# Patient Record
Sex: Male | Born: 1945 | Race: Black or African American | Hispanic: No | Smoking: Former smoker
Health system: Southern US, Community
[De-identification: ages and names within clinical notes are randomized; demographics above are authoritative.]

## PROBLEM LIST (undated history)

## (undated) DIAGNOSIS — M1990 Unspecified osteoarthritis, unspecified site: Secondary | ICD-10-CM

## (undated) DIAGNOSIS — G4730 Sleep apnea, unspecified: Secondary | ICD-10-CM

## (undated) DIAGNOSIS — K5790 Diverticulosis of intestine, part unspecified, without perforation or abscess without bleeding: Secondary | ICD-10-CM

## (undated) DIAGNOSIS — H409 Unspecified glaucoma: Secondary | ICD-10-CM

## (undated) DIAGNOSIS — E291 Testicular hypofunction: Secondary | ICD-10-CM

## (undated) DIAGNOSIS — T7840XA Allergy, unspecified, initial encounter: Secondary | ICD-10-CM

## (undated) DIAGNOSIS — G43909 Migraine, unspecified, not intractable, without status migrainosus: Secondary | ICD-10-CM

## (undated) DIAGNOSIS — E785 Hyperlipidemia, unspecified: Secondary | ICD-10-CM

## (undated) DIAGNOSIS — E119 Type 2 diabetes mellitus without complications: Secondary | ICD-10-CM

## (undated) DIAGNOSIS — I10 Essential (primary) hypertension: Secondary | ICD-10-CM

## (undated) HISTORY — DX: Essential (primary) hypertension: I10

## (undated) HISTORY — DX: Migraine, unspecified, not intractable, without status migrainosus: G43.909

## (undated) HISTORY — DX: Diverticulosis of intestine, part unspecified, without perforation or abscess without bleeding: K57.90

## (undated) HISTORY — DX: Hyperlipidemia, unspecified: E78.5

## (undated) HISTORY — DX: Unspecified glaucoma: H40.9

## (undated) HISTORY — DX: Sleep apnea, unspecified: G47.30

## (undated) HISTORY — DX: Unspecified osteoarthritis, unspecified site: M19.90

## (undated) HISTORY — DX: Allergy, unspecified, initial encounter: T78.40XA

## (undated) HISTORY — DX: Testicular hypofunction: E29.1

## (undated) HISTORY — DX: Type 2 diabetes mellitus without complications: E11.9

---

## 1990-01-20 HISTORY — PX: ANTERIOR CRUCIATE LIGAMENT REPAIR: SHX115

## 2006-01-20 HISTORY — PX: ROTATOR CUFF REPAIR: SHX139

## 2006-03-19 ENCOUNTER — Ambulatory Visit: Payer: Self-pay | Admitting: Internal Medicine

## 2006-03-31 ENCOUNTER — Ambulatory Visit: Payer: Self-pay | Admitting: Internal Medicine

## 2006-03-31 HISTORY — PX: COLONOSCOPY: SHX174

## 2008-08-30 ENCOUNTER — Encounter: Payer: Self-pay | Admitting: Cardiology

## 2008-09-06 ENCOUNTER — Encounter (INDEPENDENT_AMBULATORY_CARE_PROVIDER_SITE_OTHER): Payer: Self-pay | Admitting: *Deleted

## 2013-10-10 ENCOUNTER — Encounter: Payer: Self-pay | Admitting: Neurology

## 2013-10-14 ENCOUNTER — Encounter: Payer: Self-pay | Admitting: Neurology

## 2013-10-14 ENCOUNTER — Ambulatory Visit (INDEPENDENT_AMBULATORY_CARE_PROVIDER_SITE_OTHER): Payer: Federal, State, Local not specified - PPO | Admitting: Neurology

## 2013-10-14 ENCOUNTER — Encounter (INDEPENDENT_AMBULATORY_CARE_PROVIDER_SITE_OTHER): Payer: Self-pay

## 2013-10-14 VITALS — BP 125/85 | HR 61 | Resp 17 | Ht 74.4 in | Wt 212.0 lb

## 2013-10-14 DIAGNOSIS — R0609 Other forms of dyspnea: Secondary | ICD-10-CM

## 2013-10-14 DIAGNOSIS — M2610 Unspecified anomaly of jaw-cranial base relationship: Secondary | ICD-10-CM

## 2013-10-14 DIAGNOSIS — G4726 Circadian rhythm sleep disorder, shift work type: Secondary | ICD-10-CM | POA: Insufficient documentation

## 2013-10-14 DIAGNOSIS — R0989 Other specified symptoms and signs involving the circulatory and respiratory systems: Secondary | ICD-10-CM

## 2013-10-14 DIAGNOSIS — M2619 Other specified anomalies of jaw-cranial base relationship: Secondary | ICD-10-CM | POA: Insufficient documentation

## 2013-10-14 DIAGNOSIS — G4733 Obstructive sleep apnea (adult) (pediatric): Secondary | ICD-10-CM | POA: Insufficient documentation

## 2013-10-14 DIAGNOSIS — Z91199 Patient's noncompliance with other medical treatment and regimen due to unspecified reason: Secondary | ICD-10-CM

## 2013-10-14 DIAGNOSIS — R0683 Snoring: Secondary | ICD-10-CM | POA: Insufficient documentation

## 2013-10-14 DIAGNOSIS — Z9119 Patient's noncompliance with other medical treatment and regimen: Secondary | ICD-10-CM | POA: Insufficient documentation

## 2013-10-14 NOTE — Patient Instructions (Signed)
Polysomnography (Sleep Studies) Polysomnography (PSG) is a series of tests used for detecting (diagnosing) obstructive sleep apnea and other sleep disorders. The tests measure how some parts of your body are working while you are sleeping. The tests are extensive and expensive. They are done in a sleep lab or hospital, and vary from center to center. Your caregiver may perform other more simple sleep studies and questionnaires before doing more complete and involved testing. Testing may not be covered by insurance. Some of these tests are:  An EEG (Electroencephalogram). This tests your brain waves and stages of sleep.  An EOG (Electrooculogram). This measures the movements of your eyes. It detects periods of REM (rapid eye movement) sleep, which is your dream sleep.  An EKG (Electrocardiogram). This measures your heart rhythm.  EMG (Electromyography). This is a measurement of how the muscles are working in your upper airway and your legs while sleeping.  An oximetry measurement. It measures how much oxygen (air) you are getting while sleeping.  Breathing efforts may be measured. The same test can be interpreted (understood) differently by different caregivers and centers that study sleep.  Studies may be given an apnea/hypopnea index (AHI). This is a number which is found by counting the times of no breathing or under breathing during the night, and relating those numbers to the amount of time spent in bed. When the AHI is greater than 15, the patient is likely to complain of daytime sleepiness. When the AHI is greater than 30, the patient is at increased risk for heart problems and must be followed more closely. Following the AHI also allows you to know how treatment is working. Simple oximetry (tracking the amount of oxygen that is taken in) can be used for screening patients who:  Do not have symptoms (problems) of OSA.  Have a normal Epworth Sleepiness Scale Score.  Have a low pre-test  probability of having OSA.  Have none of the upper airway problems likely to cause apnea.  Oximetry is also used to determine if treatment is effective in patients who showed significant desaturations (not getting enough oxygen) on their home sleep study. One extra measure of safety is to perform additional studies for the person who only snores. This is because no one can predict with absolute certainty who will have OSA. Those who show significant desaturations (not getting enough oxygen) are recommended to have a more detailed sleep study. Document Released: 07/13/2002 Document Revised: 03/31/2011 Document Reviewed: 03/14/2013 ExitCare Patient Information 2015 [employer]. This information is not intended to replace advice given to you by your health care provider. Make sure you discuss any questions you have with your health care provider.  

## 2013-10-14 NOTE — Progress Notes (Signed)
SLEEP MEDICINE CLINIC   Provider:  Larey Seat, M D  Referring Provider: Haywood Pao, MD Primary Care Physician:  Haywood Pao, MD  Chief Complaint  Patient presents with  . New Evaluation    Room 11  . Sleep consult    HPI:  Jerry Caldwell is a 68 y.o. afro-american , right handed , married  male , who is seen here as a referral from Dr. Alfonso Patten. Tisovec for a sleep evaluation.  [name] has a complicating history of Diabetes, HTN, hyperlipidemia, hypogonadism and is on multiple testosterone levels and erectile dysfunction. The patient also has common migraines and an AV block first degree. The patient was diagnosed with OSA in 2011 at the [facility] , Dr. Jonathon Bellows  prescribed a CPAP- he was never comfortable with using it, tried full facial masks ( unable to find a good seal ) , nasal masks and nasal pillows, Now  On pillows he has sinusitis and rhinitis, but he has discharge and still a dry mouth.     He is working a late shift. He works form 3 Pm to 11.30 PM and  Eats dinner at work, he reaches Home at midnight, watches the news and has a snack. He goes to bed around 2 AM and usually sleeps promptly.  He cannot recall dreams,  2 nocturias , used to have 4-5 before using a bladder control medication.  He takes HCTZ, a diuretic in AM. His spouse reports he still snores on CPAP with nasal pillow. He wakes up spontaneously at 8 AM and feels "OK" not usually refreshed but tolerable. He goes to the [employer] before going to work. 2 cups of iced coffee. He takes iced tea to work to drink and stay awake. He often has migraine in the morning, started Topiramate upon [facility] in 2014, no HA now.  In late August the patient sprained his ankle which was sore for a few days but is better now apparently there was no fracture found he has been taking naproxen to treat this pain. The patient has seen Dr Levin Bacon orthopedics and after a shot of cortisone has been able to resume  his usual exercise routine. He walks and runs at least twice a week.   The patient is a former smoker quit in 1989. He does not use alcohol and he drinks 2 caffeinated beverages a day. The patient is married with 4 children and works in the [employer]. He is also a  Scientist, research (life sciences) for 22 years. He has no history of ENT, facial , neck , spine surgery or injury.  He is not obese and has never been morbidly obese. The highest weight was 230 pounds  , at a height of 6.2 - that' was  his weight when he developed diabetes in 2012.   No  Personal or family history of sleep disorder. His younger sister has a sleep study in the next month.  [name] has kindly brought his CPAP to today's visit -he used to machine only the last 4 days of 30 days at about 3 hours 34 minutes total time . His AHI was very high in spite of using a by flexible  BiPAP  The AHI was 30.9!  He has an EPAP pressure of 12.5 and an IPAP pressure of 25 cm water -  I cannot see how the patient would benefit from the current BiPAP  system having this large residual AHI.  The patient never has been  on a plain PAP before ,  he was started on BiPAP. I do not have access  today to his [facility] based  sleep study reports.  Assessment  : this patient obviously has been diagnosed with sleep apnea before but I don't know what his diagnostic AHI may have been. Looking at his download adult that he benefits right now from using a BiPAP flex  Machine.  I would like for this patient to be reevaluated and retreated. He is currently not suffering from any headaches in the morning -CO2 measure may not have to be obtained.  I would like for him to have a shift work adjusted sleep study which would allow him to come in at midnight and conclude the  study at around 8 AM  Review of Systems: Out of a complete 14 system review, the patient complains of only the following symptoms, and all other reviewed systems are negative.   Epworth score  10 , Fatigue  severity score 15  , depression score 1    History   Social History  . Marital Status: Married    Spouse Name: Jerry Caldwell    Number of Children: 4  . Years of Education: College   Occupational History  . Not on file.   Social History Main Topics  . Smoking status: Former Research scientist (life sciences)  . Smokeless tobacco: Never Used  . Alcohol Use: No  . Drug Use: No  . Sexual Activity: Not on file   Other Topics Concern  . Not on file   Social History Narrative   Patient is married Jerry Caldwell)    Patient has four children.   Patient has a college degree.   Patient is a Special educational needs teacher.   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   Patient is right-handed.   Patient drinks caffeine, coffee - occasionally and tea-three times per week.    Family History  Problem Relation Age of Onset  . Glaucoma Mother   . Diabetes type II Maternal Aunt   . Diabetes Sister     Past Medical History  Diagnosis Date  . HTN (hypertension)   . Hyperlipidemia   . Hypogonadism in male   . Migraines   . Diabetes     Past Surgical History  Procedure Laterality Date  . Anterior cruciate ligament repair  1992  . Rotator cuff repair Right 2008    Current Outpatient Prescriptions  Medication Sig Dispense Refill  . atenolol (TENORMIN) 25 MG tablet Take 25 mg by mouth daily.      Marland Kitchen atorvastatin (LIPITOR) 40 MG tablet Take 40 mg by mouth daily.      . Chlorpheniramine-APAP (CORICIDIN) 2-325 MG TABS Take by mouth. As needed      . hydrochlorothiazide (HYDRODIURIL) 25 MG tablet Take 25 mg by mouth daily.      Marland Kitchen latanoprost (XALATAN) 0.005 % ophthalmic solution Place 1 drop into both eyes at bedtime.       Marland Kitchen losartan (COZAAR) 50 MG tablet Take 50 mg by mouth daily.      . metFORMIN (GLUCOPHAGE) 500 MG tablet Take 500 mg by mouth daily.      . naproxen (NAPROSYN) 500 MG tablet Take 500 mg by mouth. As needed      . Testosterone (ANDROGEL) 20.25 MG/1.25GM (1.62%) GEL Place 1 application onto the skin daily.       . timolol (TIMOPTIC) 0.5 % ophthalmic solution Place 1 drop into both eyes 2 (two) times daily.  No current facility-administered medications for this visit.    Allergies as of 10/14/2013  . (Not on File)    Vitals: BP 125/85  Pulse 61  Resp 17 Last Weight:  Wt Readings from Last 1 Encounters:  No data found for Wt       Last Height:   Ht Readings from Last 1 Encounters:  No data found for Ht    Physical exam:  General: The patient is awake, alert and appears not in acute distress. The patient is well groomed. Head: Normocephalic, atraumatic. Neck is supple. Mallampati 4 ,  neck circumference: 16.5  Nasal airflow restricted, congested , TMJ is not evident . Retrognathia is seen.  Cardiovascular:  Regular rate and rhythm , without  murmurs or carotid bruit, and without distended neck veins. Respiratory: Lungs are clear to auscultation. Skin:  Without evidence of edema, or rash Trunk: BMI is elevated and patient  has normal posture.  Neurologic exam : The patient is awake and alert, oriented to place and time.   Memory subjective  described as intact. There is a normal attention span & concentration ability. Speech is fluent without dysarthria, dysphonia or aphasia. Mood and affect are appropriate.  Cranial nerves:  Sense of smell and taste reported normal.  Pupils are equal and briskly reactive to light. Funduscopic exam without  evidence of pallor or edema.  Extraocular movements  in vertical and horizontal planes intact and without nystagmus. Visual fields by finger perimetry are intact. Hearing to finger rub intact. Facial sensation intact to fine touch. Facial motor strength is symmetric and tongue  Midline. Uvula cannot bevisualized   Motor exam:   Normal tone, muscle bulk and symmetric strength in all extremities. No change in fine motor skills, handwriting, etc.   Sensory:  Fine touch, pinprick and vibration were tested in all extremities.  Proprioception is  normal.  Coordination: Rapid alternating movements in the fingers/hands is normal.   Gait and station: Patient walks without assistive device and is able unassisted to climb up to the exam table.  Strength within normal limits. Stance is stable and normal. Tandem gait is unfragmented. Romberg testing is   negative.  Deep tendon reflexes: in the  upper and lower extremities are symmetric and intact. Babinski  downgoing.   Assessment:  After physical and neurologic examination, review of laboratory studies, imaging, neurophysiology testing and pre-existing records, assessment is  1) patient with unknown level of sleep apnea, on BiPAP , never failed CPAP. 2) current BiPAP is not working , AHI is 30 ! Last [facility] visit was 2 years ago.  3) current mask causes sinus trouble.  4) shift work Environmental manager, cumulative sleep deprivation.  5) retrognathia and macroglossia, mallompati 4 - high risk OSA and snoring.    The patient was advised of the nature of the diagnosed sleep disorder , the treatment options and risks for general a health and wellness arising from not treating the condition. Visit duration was 45 minutes.   Plan:  Treatment plan and additional workup : Re-evaluation , SPLIT at 29 and score at 4 % - plain BCBS Medicare -[employer].  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  Headaches  resolved on Topiramate, still if available , check CO2 !.  Patient will bring his study data to the sleep lab appointment.       Asencion Partridge Simpson Paulos MD  10/14/2013

## 2013-12-27 ENCOUNTER — Encounter: Payer: Self-pay | Admitting: Neurology

## 2013-12-28 ENCOUNTER — Ambulatory Visit (INDEPENDENT_AMBULATORY_CARE_PROVIDER_SITE_OTHER): Payer: Federal, State, Local not specified - PPO | Admitting: Neurology

## 2013-12-28 DIAGNOSIS — R0683 Snoring: Secondary | ICD-10-CM

## 2013-12-28 DIAGNOSIS — G4726 Circadian rhythm sleep disorder, shift work type: Secondary | ICD-10-CM

## 2013-12-28 DIAGNOSIS — Z9119 Patient's noncompliance with other medical treatment and regimen: Secondary | ICD-10-CM

## 2013-12-28 DIAGNOSIS — Z91199 Patient's noncompliance with other medical treatment and regimen due to unspecified reason: Secondary | ICD-10-CM

## 2013-12-28 DIAGNOSIS — M2619 Other specified anomalies of jaw-cranial base relationship: Secondary | ICD-10-CM

## 2013-12-28 DIAGNOSIS — G4733 Obstructive sleep apnea (adult) (pediatric): Secondary | ICD-10-CM

## 2013-12-29 NOTE — Sleep Study (Signed)
Please see the scanned sleep study interpretation located in the Procedure tab within the Chart Review section. 

## 2014-01-06 ENCOUNTER — Other Ambulatory Visit: Payer: Self-pay | Admitting: Neurology

## 2014-01-06 ENCOUNTER — Telehealth: Payer: Self-pay | Admitting: Neurology

## 2014-01-06 DIAGNOSIS — G4733 Obstructive sleep apnea (adult) (pediatric): Secondary | ICD-10-CM

## 2014-01-06 NOTE — Telephone Encounter (Signed)
I called the patient and reviewed the finding of his sleep study results with him.  He is aware of the finding of obstructive sleep apnea.  Due to this finding, Dr. Brett Fairy has recommended that he return for a CPAP titration to optimize therapy.  He is aware that he will be contacted with an appointment for his CPAP titration.  The referring provider will be sent a copy of this report and the patient has requested to send his report via mail.

## 2014-02-15 ENCOUNTER — Ambulatory Visit (INDEPENDENT_AMBULATORY_CARE_PROVIDER_SITE_OTHER): Payer: Federal, State, Local not specified - PPO | Admitting: Neurology

## 2014-02-15 DIAGNOSIS — G4733 Obstructive sleep apnea (adult) (pediatric): Secondary | ICD-10-CM

## 2014-02-16 NOTE — Sleep Study (Signed)
Please see the scanned sleep study interpretation located in the Procedure tab within the chart review section

## 2014-02-24 ENCOUNTER — Encounter: Payer: Self-pay | Admitting: Neurology

## 2014-02-24 ENCOUNTER — Telehealth: Payer: Self-pay | Admitting: *Deleted

## 2014-02-24 ENCOUNTER — Other Ambulatory Visit: Payer: Self-pay | Admitting: Neurology

## 2014-02-24 ENCOUNTER — Encounter: Payer: Self-pay | Admitting: *Deleted

## 2014-02-24 DIAGNOSIS — G4733 Obstructive sleep apnea (adult) (pediatric): Secondary | ICD-10-CM

## 2014-02-24 NOTE — Telephone Encounter (Signed)
Patient was contacted and provided the results of his most recent CPAP titration study.  Patient was advised that CPAP therapy was effective in treating his sleep apnea.  Patient was informed that CPAP therapy had been recommended for home use by Dr. Brett Fairy.  The patient was in agreement and was referred to [facility] for CPAP set up.  Dr. Domenick Gong was faxed a copy of the report.  The patient gave verbal permission to mail a copy of his test results.   Patient instructed to contact our office 6-8 weeks post set up to schedule a follow up appointment.

## 2014-06-21 ENCOUNTER — Other Ambulatory Visit: Payer: Self-pay | Admitting: Neurology

## 2014-06-21 DIAGNOSIS — R519 Headache, unspecified: Secondary | ICD-10-CM

## 2014-06-21 DIAGNOSIS — R51 Headache: Principal | ICD-10-CM

## 2014-06-30 ENCOUNTER — Ambulatory Visit
Admission: RE | Admit: 2014-06-30 | Discharge: 2014-06-30 | Disposition: A | Discharge status: Home / Self Care | Payer: Federal, State, Local not specified - PPO | Source: Ambulatory Visit | Attending: Neurology | Admitting: Neurology

## 2014-06-30 DIAGNOSIS — R51 Headache: Principal | ICD-10-CM

## 2014-06-30 DIAGNOSIS — R519 Headache, unspecified: Secondary | ICD-10-CM

## 2014-07-05 ENCOUNTER — Other Ambulatory Visit (HOSPITAL_COMMUNITY)
Admission: RE | Admit: 2014-07-05 | Discharge: 2014-07-05 | Disposition: A | Discharge status: Home / Self Care | Payer: Federal, State, Local not specified - PPO | Source: Ambulatory Visit | Attending: Otolaryngology | Admitting: Otolaryngology

## 2014-07-05 ENCOUNTER — Other Ambulatory Visit: Payer: Self-pay | Admitting: Otolaryngology

## 2014-07-05 DIAGNOSIS — D110 Benign neoplasm of parotid gland: Secondary | ICD-10-CM | POA: Diagnosis present

## 2016-03-18 ENCOUNTER — Encounter: Payer: Self-pay | Admitting: Internal Medicine

## 2016-04-01 ENCOUNTER — Encounter: Payer: Self-pay | Admitting: Internal Medicine

## 2016-06-03 ENCOUNTER — Ambulatory Visit (AMBULATORY_SURGERY_CENTER): Payer: Self-pay | Admitting: *Deleted

## 2016-06-03 VITALS — Ht 74.0 in | Wt 214.8 lb

## 2016-06-03 DIAGNOSIS — Z1211 Encounter for screening for malignant neoplasm of colon: Secondary | ICD-10-CM

## 2016-06-03 MED ORDER — NA SULFATE-K SULFATE-MG SULF 17.5-3.13-1.6 GM/177ML PO SOLN: 1.0000 | Freq: Once | ORAL | 0 refills | Status: AC

## 2016-06-03 NOTE — Progress Notes (Signed)
No egg or soy allergy known to patient  No issues with past sedation with any surgeries  or procedures, no intubation problems  No diet pills per patient No home 02 use per patient  No blood thinners per patient  Pt denies issues with constipation  No A fib or A flutter  EMMI video sent to pt's e mail  

## 2016-06-17 ENCOUNTER — Encounter: Payer: Self-pay | Admitting: Internal Medicine

## 2016-06-17 ENCOUNTER — Ambulatory Visit (AMBULATORY_SURGERY_CENTER): Payer: Medicare Other | Admitting: Internal Medicine

## 2016-06-17 VITALS — BP 113/73 | HR 59 | Temp 97.8°F | Resp 11 | Ht 74.0 in | Wt 214.0 lb

## 2016-06-17 DIAGNOSIS — Z1212 Encounter for screening for malignant neoplasm of rectum: Secondary | ICD-10-CM | POA: Diagnosis not present

## 2016-06-17 DIAGNOSIS — Z1211 Encounter for screening for malignant neoplasm of colon: Secondary | ICD-10-CM

## 2016-06-17 MED ORDER — SODIUM CHLORIDE 0.9 % IV SOLN: 500.0000 mL | INTRAVENOUS | Status: DC

## 2016-06-17 NOTE — Patient Instructions (Signed)
Discharge instructions given. Handouts on diverticulosis and hemorrhoids. Resume previous medications. YOU HAD AN ENDOSCOPIC PROCEDURE TODAY AT THE [facility]:   Refer to the procedure report that was given to you for any specific questions about what was found during the examination.  If the procedure report does not answer your questions, please call your gastroenterologist to clarify.  If you requested that your care partner not be given the details of your procedure findings, then the procedure report has been included in a sealed envelope for you to review at your convenience later.  YOU SHOULD EXPECT: Some feelings of bloating in the abdomen. Passage of more gas than usual.  Walking can help get rid of the air that was put into your GI tract during the procedure and reduce the bloating. If you had a lower endoscopy (such as a colonoscopy or flexible sigmoidoscopy) you may notice spotting of blood in your stool or on the toilet paper. If you underwent a bowel prep for your procedure, you may not have a normal bowel movement for a few days.  Please Note:  You might notice some irritation and congestion in your nose or some drainage.  This is from the oxygen used during your procedure.  There is no need for concern and it should clear up in a day or so.  SYMPTOMS TO REPORT IMMEDIATELY:   Following lower endoscopy (colonoscopy or flexible sigmoidoscopy):  Excessive amounts of blood in the stool  Significant tenderness or worsening of abdominal pains  Swelling of the abdomen that is new, acute  Fever of 100F or higher   For urgent or emergent issues, a gastroenterologist can be reached at any hour by calling [phone].   DIET:  We do recommend a small meal at first, but then you may proceed to your regular diet.  Drink plenty of fluids but you should avoid alcoholic beverages for 24 hours.  ACTIVITY:  You should plan to take it easy for the rest of today and you should  NOT DRIVE or use heavy machinery until tomorrow (because of the sedation medicines used during the test).    FOLLOW UP: Our staff will call the number listed on your records the next business day following your procedure to check on you and address any questions or concerns that you may have regarding the information given to you following your procedure. If we do not reach you, we will leave a message.  However, if you are feeling well and you are not experiencing any problems, there is no need to return our call.  We will assume that you have returned to your regular daily activities without incident.  If any biopsies were taken you will be contacted by phone or by letter within the next 1-3 weeks.  Please call us at [phone] if you have not heard about the biopsies in 3 weeks.    SIGNATURES/CONFIDENTIALITY: You and/or your care partner have signed paperwork which will be entered into your electronic medical record.  These signatures attest to the fact that that the information above on your After Visit Summary has been reviewed and is understood.  Full responsibility of the confidentiality of this discharge information lies with you and/or your care-partner. 

## 2016-06-17 NOTE — Op Note (Signed)
[name] Procedure Date: 06/17/2016 10:47 AM MRN: [id number] Endoscopist: Docia Chuck. Henrene Pastor , MD Age: 71 Referring MD:  Date of Birth: 04/20/1945 Gender: Male Account #: [id number] Procedure:                Colonoscopy Indications:              Screening for colorectal malignant neoplasm.                            Negative index exam 2008 Medicines:                Monitored Anesthesia Care Procedure:                Pre-Anesthesia Assessment:                           - Prior to the procedure, a History and Physical                            was performed, and patient medications and                            allergies were reviewed. The patient's tolerance of                            previous anesthesia was also reviewed. The risks                            and benefits of the procedure and the sedation                            options and risks were discussed with the patient.                            All questions were answered, and informed consent                            was obtained. Prior Anticoagulants: The patient has                            taken no previous anticoagulant or antiplatelet                            agents. ASA Grade Assessment: II - A patient with                            mild systemic disease. After reviewing the risks                            and benefits, the patient was deemed in                            satisfactory condition to undergo the procedure.  After obtaining informed consent, the colonoscope                            was passed under direct vision. Throughout the                            procedure, the patient's blood pressure, pulse, and                            oxygen saturations were monitored continuously. The                            Colonoscope was introduced through the anus and                            advanced to the the cecum, identified by                             appendiceal orifice and ileocecal valve. The                            ileocecal valve, appendiceal orifice, and rectum                            were photographed. The quality of the bowel                            preparation was excellent. The colonoscopy was                            performed without difficulty. The patient tolerated                            the procedure well. The bowel preparation used was                            SUPREP. Scope In: 10:58:43 AM Scope Out: 11:10:43 AM Scope Withdrawal Time: 0 hours 10 minutes 34 seconds  Total Procedure Duration: 0 hours 12 minutes 0 seconds  Findings:                 Multiple diverticula were found in the entire colon.                           A diffuse area of mild melanosis was found in the                            entire colon.                           Internal hemorrhoids were found during retroflexion.                           The exam was otherwise without abnormality on  direct and retroflexion views. Complications:            No immediate complications. Estimated blood loss:                            None. Estimated Blood Loss:     Estimated blood loss: none. Recommendation:           - Repeat colonoscopy in 10 years for screening                            purposes.                           - Patient has a contact number available for                            emergencies. The signs and symptoms of potential                            delayed complications were discussed with the                            patient. Return to normal activities tomorrow.                            Written discharge instructions were provided to the                            patient.                           - Resume previous diet.                           - Continue present medications. Docia Chuck. Henrene Pastor, MD 06/17/2016 11:14:19 AM This report has been signed  electronically.

## 2016-06-17 NOTE — Progress Notes (Signed)
Report to PACU, RN, vss, BBS= Clear.  

## 2016-06-18 ENCOUNTER — Telehealth: Payer: Self-pay | Admitting: *Deleted

## 2016-06-18 NOTE — Telephone Encounter (Signed)
Left message on f/u call 

## 2016-06-18 NOTE — Telephone Encounter (Signed)
  Follow up Call-  Call back number 06/17/2016  Post procedure Call Back phone  # [phone]  Permission to leave phone message Yes  Some recent data might be hidden     Patient questions:  Do you have a fever, pain , or abdominal swelling? No. Pain Score  0 *  Have you tolerated food without any problems? Yes.    Have you been able to return to your normal activities? Yes.    Do you have any questions about your discharge instructions: Diet   No. Medications             no Follow up visit  No.  Do you have questions or concerns about your Care? No.  Actions: * If pain score is 4 or above: No action needed, pain <4.

## 2019-02-21 ENCOUNTER — Encounter (HOSPITAL_BASED_OUTPATIENT_CLINIC_OR_DEPARTMENT_OTHER): Payer: Self-pay | Admitting: *Deleted

## 2019-02-21 ENCOUNTER — Emergency Department (HOSPITAL_BASED_OUTPATIENT_CLINIC_OR_DEPARTMENT_OTHER): Payer: Medicare Other

## 2019-02-21 ENCOUNTER — Other Ambulatory Visit: Payer: Self-pay

## 2019-02-21 ENCOUNTER — Inpatient Hospital Stay (HOSPITAL_BASED_OUTPATIENT_CLINIC_OR_DEPARTMENT_OTHER)
Admission: EM | Admit: 2019-02-21 | Discharge: 2019-02-28 | DRG: 871 | Disposition: A | Discharge status: Home Health | Payer: Medicare Other | Attending: Internal Medicine | Admitting: Internal Medicine

## 2019-02-21 DIAGNOSIS — E870 Hyperosmolality and hypernatremia: Secondary | ICD-10-CM | POA: Diagnosis present

## 2019-02-21 DIAGNOSIS — Z791 Long term (current) use of non-steroidal anti-inflammatories (NSAID): Secondary | ICD-10-CM | POA: Diagnosis not present

## 2019-02-21 DIAGNOSIS — E860 Dehydration: Secondary | ICD-10-CM | POA: Diagnosis present

## 2019-02-21 DIAGNOSIS — I10 Essential (primary) hypertension: Secondary | ICD-10-CM | POA: Diagnosis present

## 2019-02-21 DIAGNOSIS — Z7984 Long term (current) use of oral hypoglycemic drugs: Secondary | ICD-10-CM

## 2019-02-21 DIAGNOSIS — F0390 Unspecified dementia without behavioral disturbance: Secondary | ICD-10-CM | POA: Diagnosis present

## 2019-02-21 DIAGNOSIS — Z20822 Contact with and (suspected) exposure to covid-19: Secondary | ICD-10-CM | POA: Diagnosis present

## 2019-02-21 DIAGNOSIS — Z7982 Long term (current) use of aspirin: Secondary | ICD-10-CM | POA: Diagnosis not present

## 2019-02-21 DIAGNOSIS — G4733 Obstructive sleep apnea (adult) (pediatric): Secondary | ICD-10-CM | POA: Diagnosis present

## 2019-02-21 DIAGNOSIS — A419 Sepsis, unspecified organism: Secondary | ICD-10-CM | POA: Diagnosis present

## 2019-02-21 DIAGNOSIS — E1165 Type 2 diabetes mellitus with hyperglycemia: Secondary | ICD-10-CM | POA: Diagnosis not present

## 2019-02-21 DIAGNOSIS — Z833 Family history of diabetes mellitus: Secondary | ICD-10-CM | POA: Diagnosis not present

## 2019-02-21 DIAGNOSIS — N179 Acute kidney failure, unspecified: Secondary | ICD-10-CM | POA: Diagnosis present

## 2019-02-21 DIAGNOSIS — T380X5A Adverse effect of glucocorticoids and synthetic analogues, initial encounter: Secondary | ICD-10-CM | POA: Diagnosis not present

## 2019-02-21 DIAGNOSIS — Z79899 Other long term (current) drug therapy: Secondary | ICD-10-CM | POA: Diagnosis not present

## 2019-02-21 DIAGNOSIS — R7989 Other specified abnormal findings of blood chemistry: Secondary | ICD-10-CM | POA: Diagnosis not present

## 2019-02-21 DIAGNOSIS — E785 Hyperlipidemia, unspecified: Secondary | ICD-10-CM | POA: Diagnosis present

## 2019-02-21 DIAGNOSIS — F05 Delirium due to known physiological condition: Secondary | ICD-10-CM | POA: Diagnosis not present

## 2019-02-21 DIAGNOSIS — N400 Enlarged prostate without lower urinary tract symptoms: Secondary | ICD-10-CM | POA: Diagnosis present

## 2019-02-21 DIAGNOSIS — H409 Unspecified glaucoma: Secondary | ICD-10-CM | POA: Diagnosis present

## 2019-02-21 DIAGNOSIS — N289 Disorder of kidney and ureter, unspecified: Secondary | ICD-10-CM

## 2019-02-21 DIAGNOSIS — Z87891 Personal history of nicotine dependence: Secondary | ICD-10-CM | POA: Diagnosis not present

## 2019-02-21 DIAGNOSIS — J189 Pneumonia, unspecified organism: Secondary | ICD-10-CM | POA: Diagnosis present

## 2019-02-21 DIAGNOSIS — J9601 Acute respiratory failure with hypoxia: Secondary | ICD-10-CM | POA: Diagnosis present

## 2019-02-21 DIAGNOSIS — N281 Cyst of kidney, acquired: Secondary | ICD-10-CM

## 2019-02-21 DIAGNOSIS — R0902 Hypoxemia: Secondary | ICD-10-CM

## 2019-02-21 LAB — POCT I-STAT 7, (LYTES, BLD GAS, ICA,H+H)
Acid-Base Excess: 3 mmol/L — ABNORMAL HIGH (ref 0.0–2.0)
Bicarbonate: 25.8 mmol/L (ref 20.0–28.0)
Calcium, Ion: 1.26 mmol/L (ref 1.15–1.40)
HCT: 38 % — ABNORMAL LOW (ref 39.0–52.0)
Hemoglobin: 12.9 g/dL — ABNORMAL LOW (ref 13.0–17.0)
O2 Saturation: 97 %
Patient temperature: 97.8
Potassium: 3.9 mmol/L (ref 3.5–5.1)
Sodium: 145 mmol/L (ref 135–145)
TCO2: 27 mmol/L (ref 22–32)
pCO2 arterial: 31.4 mmHg — ABNORMAL LOW (ref 32.0–48.0)
pH, Arterial: 7.522 — ABNORMAL HIGH (ref 7.350–7.450)
pO2, Arterial: 75 mmHg — ABNORMAL LOW (ref 83.0–108.0)

## 2019-02-21 LAB — CBC WITH DIFFERENTIAL/PLATELET
Abs Immature Granulocytes: 0.07 10*3/uL (ref 0.00–0.07)
Basophils Absolute: 0 10*3/uL (ref 0.0–0.1)
Basophils Relative: 0 %
Eosinophils Absolute: 0 10*3/uL (ref 0.0–0.5)
Eosinophils Relative: 0 %
HCT: 46.2 % (ref 39.0–52.0)
Hemoglobin: 14.9 g/dL (ref 13.0–17.0)
Immature Granulocytes: 1 %
Lymphocytes Relative: 12 %
Lymphs Abs: 0.9 10*3/uL (ref 0.7–4.0)
MCH: 26.4 pg (ref 26.0–34.0)
MCHC: 32.3 g/dL (ref 30.0–36.0)
MCV: 81.9 fL (ref 80.0–100.0)
Monocytes Absolute: 0.6 10*3/uL (ref 0.1–1.0)
Monocytes Relative: 8 %
Neutro Abs: 6.1 10*3/uL (ref 1.7–7.7)
Neutrophils Relative %: 79 %
Platelets: 362 10*3/uL (ref 150–400)
RBC: 5.64 MIL/uL (ref 4.22–5.81)
RDW: 14.4 % (ref 11.5–15.5)
WBC: 7.7 10*3/uL (ref 4.0–10.5)
nRBC: 0.5 % — ABNORMAL HIGH (ref 0.0–0.2)

## 2019-02-21 LAB — COMPREHENSIVE METABOLIC PANEL
ALT: 24 U/L (ref 0–44)
AST: 39 U/L (ref 15–41)
Albumin: 3.2 g/dL — ABNORMAL LOW (ref 3.5–5.0)
Alkaline Phosphatase: 76 U/L (ref 38–126)
Anion gap: 12 (ref 5–15)
BUN: 27 mg/dL — ABNORMAL HIGH (ref 8–23)
CO2: 24 mmol/L (ref 22–32)
Calcium: 9.6 mg/dL (ref 8.9–10.3)
Chloride: 112 mmol/L — ABNORMAL HIGH (ref 98–111)
Creatinine, Ser: 1.41 mg/dL — ABNORMAL HIGH (ref 0.61–1.24)
GFR calc Af Amer: 58 mL/min — ABNORMAL LOW (ref >60)
GFR calc non Af Amer: 50 mL/min — ABNORMAL LOW (ref >60)
Glucose, Bld: 187 mg/dL — ABNORMAL HIGH (ref 70–99)
Potassium: 3.7 mmol/L (ref 3.5–5.1)
Sodium: 148 mmol/L — ABNORMAL HIGH (ref 135–145)
Total Bilirubin: 1.2 mg/dL (ref 0.3–1.2)
Total Protein: 8.6 g/dL — ABNORMAL HIGH (ref 6.5–8.1)

## 2019-02-21 LAB — BRAIN NATRIURETIC PEPTIDE: B Natriuretic Peptide: 43.4 pg/mL (ref 0.0–100.0)

## 2019-02-21 LAB — TROPONIN I (HIGH SENSITIVITY)
Troponin I (High Sensitivity): 14 ng/L (ref <18)
Troponin I (High Sensitivity): 16 ng/L (ref <18)

## 2019-02-21 LAB — SARS CORONAVIRUS 2 BY RT PCR (HOSPITAL ORDER, PERFORMED IN ~~LOC~~ HOSPITAL LAB): SARS Coronavirus 2: NEGATIVE

## 2019-02-21 LAB — LACTIC ACID, PLASMA: Lactic Acid, Venous: 3 mmol/L (ref 0.5–1.9)

## 2019-02-21 LAB — SARS CORONAVIRUS 2 AG (30 MIN TAT): SARS Coronavirus 2 Ag: NEGATIVE

## 2019-02-21 LAB — D-DIMER, QUANTITATIVE: D-Dimer, Quant: 6.57 ug/mL-FEU — ABNORMAL HIGH (ref 0.00–0.50)

## 2019-02-21 MED ORDER — VANCOMYCIN HCL IN DEXTROSE 1-5 GM/200ML-% IV SOLN
1000.0000 mg | Freq: Once | INTRAVENOUS | Status: AC
  Administered 2019-02-21: 1000 mg via INTRAVENOUS
  Filled 2019-02-21: qty 200

## 2019-02-21 MED ORDER — IOHEXOL 350 MG/ML SOLN
100.0000 mL | Freq: Once | INTRAVENOUS | Status: AC
  Administered 2019-02-21: 100 mL via INTRAVENOUS

## 2019-02-21 MED ORDER — DEXAMETHASONE SODIUM PHOSPHATE 10 MG/ML IJ SOLN
10.0000 mg | Freq: Once | INTRAMUSCULAR | Status: AC
  Administered 2019-02-21: 10 mg via INTRAVENOUS

## 2019-02-21 MED ORDER — PIPERACILLIN-TAZOBACTAM 3.375 G IVPB
3.3750 g | Freq: Once | INTRAVENOUS | Status: AC
  Administered 2019-02-21: 3.375 g via INTRAVENOUS
  Filled 2019-02-21: qty 50

## 2019-02-21 NOTE — Care Plan (Addendum)
74 yo M with  presents with 3 days and fatigue of SOB, O2 sats 71% Wife died 1 wk ago Had family in town CXR - showing bilateral infiltrate CTA - no PE Encephalopathic 13L high flow sats 95% BP stable tachypneic upper 20's Seems altered possibly encephalopathic strong suspicion for Covid given symptoms although negative tests Unable to transport on high flow. Recommended obtaining ABG if severe abnormalities consult PCCM otherwise attempt to transition to nonrebreather if able to tolerate call back to [facility] for possible admission to stepdown If unable to tolerate nonrebreather and continues to require high flow consult PCCM.  10:06 PM Chevon Fomby   Stable on NR plan to transfer to [facility] as PUI Please up date family on arrival

## 2019-02-21 NOTE — ED Provider Notes (Addendum)
[facility] EMERGENCY DEPARTMENT Provider Note   CSN: [id number] Arrival date & time: 02/21/19  1751     History No chief complaint on file.   Jerry Geddie. is a 74 y.o. male.  Patient is a 74 year old male with past medical history of diabetes, hypertension, hyperlipidemia, and sleep apnea.  He presents today for evaluation of shortness of breath and fatigue.  This is worsened over the past several days.  Patient initially seen in triage, then found to be hypoxic and in respiratory distress.  He adds little additional history secondary to acuity of condition.  The history is provided by the patient.       Past Medical History:  Diagnosis Date  . Allergy   . Arthritis    right knee   . Diabetes ([place])   . Diverticulosis   . Glaucoma   . HTN (hypertension)   . Hyperlipidemia   . Hypogonadism in male   . Migraines   . Sleep apnea    uses cpap     Patient Active Problem List   Diagnosis Date Noted  . OSA treated with BiPAP 10/14/2013  . Compliance poor 10/14/2013  . Snoring 10/14/2013  . 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 ANTERIOR CRUCIATE LIGAMENT REPAIR  1992  . COLONOSCOPY  03/31/2006   Diverticulosis  . ROTATOR CUFF REPAIR Right 2008       Family History  Problem Relation Age of Onset  . Glaucoma Mother   . Cancer - Lung Mother   . Diabetes type II Maternal Aunt   . Diabetes Sister   . Colon cancer Neg Hx   . Colon polyps Neg Hx   . Esophageal cancer Neg Hx   . Rectal cancer Neg Hx   . Stomach cancer Neg Hx     Social History   Tobacco Use  . Smoking status: Former Research scientist (life sciences)  . Smokeless tobacco: Never Used  Substance Use Topics  . Alcohol use: No  . Drug use: No    Home Medications Prior to Admission medications   Medication Sig Start Date End Date Taking? Authorizing Provider  aspirin 81 MG chewable tablet Chew 81 mg by  mouth daily.    [provider]  atenolol (TENORMIN) 25 MG tablet Take 25 mg by mouth daily.    [provider]  atorvastatin (LIPITOR) 40 MG tablet Take 40 mg by mouth daily.    [provider]  Chlorpheniramine-APAP (CORICIDIN) 2-325 MG TABS Take by mouth. As needed    [provider]  hydrochlorothiazide (HYDRODIURIL) 25 MG tablet Take 25 mg by mouth daily.    [provider]  latanoprost (XALATAN) 0.005 % ophthalmic solution Place 1 drop into both eyes at bedtime.  07/27/13   [provider]  losartan (COZAAR) 50 MG tablet Take 50 mg by mouth daily.    [provider]  metFORMIN (GLUCOPHAGE) 500 MG tablet Take 500 mg by mouth daily.    [provider]  naproxen (NAPROSYN) 500 MG tablet Take 500 mg by mouth. As needed    [provider]  Testosterone (ANDROGEL) 20.25 MG/1.25GM (1.62%) GEL Place 1 application onto the skin daily.    [provider]  timolol (TIMOPTIC) 0.5 % ophthalmic solution Place 1 drop into both eyes 2 (two) times daily.  08/08/13   [provider]    Allergies    Lisinopril  Review of Systems   Review of Systems  Unable to perform ROS: Acuity of condition    Physical Exam Updated Vital Signs BP 126/81 (BP Location: Left Arm)   Pulse (!) 130   Temp 98.9 F (37.2 C) (Oral)   Resp (!) 30   Ht 6\' 1"  (1.854 m)   Wt 95.3 kg   SpO2 (!) 77%   BMI 27.71 kg/m   Physical Exam Vitals and nursing note reviewed.  Constitutional:      General: He is not in acute distress.    Appearance: He is well-developed. He is not diaphoretic.  HENT:     Head: Normocephalic and atraumatic.  Cardiovascular:     Rate and Rhythm: Normal rate and regular rhythm.     Heart sounds: No murmur. No friction rub.  Pulmonary:     Effort: Respiratory distress present.     Breath sounds: No wheezing or rales.     Comments: Patient tachypneic and in moderate respiratory distress.  Breath  sounds are coarse bilaterally. Abdominal:     General: Bowel sounds are normal. There is no distension.     Palpations: Abdomen is soft.     Tenderness: There is no abdominal tenderness.  Musculoskeletal:        General: Normal range of motion.     Cervical back: Normal range of motion and neck supple.  Skin:    General: Skin is warm and dry.  Neurological:     Mental Status: He is alert and oriented to person, place, and time.     Coordination: Coordination normal.     ED Results / Procedures / Treatments   Labs (all labs ordered are listed, but only abnormal results are displayed) Labs Reviewed  SARS CORONAVIRUS 2 AG (30 MIN TAT)  COMPREHENSIVE METABOLIC PANEL  BRAIN NATRIURETIC PEPTIDE  CBC WITH DIFFERENTIAL/PLATELET  TROPONIN I (HIGH SENSITIVITY)    EKG EKG Interpretation  Date/Time:  Monday February 21 2019 18:06:07 EST Ventricular Rate:  122 PR Interval:    QRS Duration: 85 QT Interval:  307 QTC Calculation: 438 R Axis:   -26 Text Interpretation: Sinus tachycardia Multiform ventricular premature complexes Aberrant complex Probable left atrial enlargement Inferior infarct, old Lateral leads are also involved Baseline wander in lead(s) V6 No prior ecg for comparison Confirmed by Veryl Speak [phone]) on 02/21/2019 6:14:05 PM   Radiology No results found.  Procedures Procedures (including critical care time)  Medications Ordered in ED Medications - No data to display  ED Course  I have reviewed the triage vital signs and the nursing notes.  Pertinent labs & imaging results that were available during my care of the patient were reviewed by me and considered in my medical decision making (see chart for details).    MDM Rules/Calculators/A&P  Patient is a 74 year old male with past medical history as outlined in the HPI.  He presents today for evaluation of weakness and shortness of breath.  He was found to be hypoxic in triage with oxygen saturations in the  70s.  Patient's rapid antigen Covid test is negative and his Cepheid Covid test is also negative.  Laboratory studies are essentially unremarkable.  Due to the persistent hypoxia, a CT angiogram of the chest was obtained showing no evidence for PE, but does show extensive bilateral infiltrates in both lung fields.  Although his Covid test thus far is negative, I have a high index of suspicion that this is Covid.  Patient was given broad-spectrum antibiotics as well  as Decadron.  I have discussed the care with Dr. Roel Cluck from the hospitalist service.  She has agreed to accept the patient in transfer.  Patient is on oxygen by nonrebreather and seems to be maintaining saturations 95% or greater.  Patient to be transported by [facility] to [place] [facility] once a bed is available.  I spent an extensive period of time speaking with the patient's daughter, Jerry Caldwell.  I updated her on her father's condition and answered her questions to the best of my ability.  She is hoping for frequent updates. Her phone number is [phone].  CRITICAL CARE Performed by: Veryl Speak Total critical care time: 70 minutes Critical care time was exclusive of separately billable procedures and treating other patients. Critical care was necessary to treat or prevent imminent or life-threatening deterioration. Critical care was time spent personally by me on the following activities: development of treatment plan with patient and/or surrogate as well as nursing, discussions with consultants, evaluation of patient's response to treatment, examination of patient, obtaining history from patient or surrogate, ordering and performing treatments and interventions, ordering and review of laboratory studies, ordering and review of radiographic studies, pulse oximetry and re-evaluation of patient's condition.  Final Clinical Impression(s) / ED Diagnoses Final diagnoses:  None    Rx / DC Orders ED Discharge Orders    None        Veryl Speak, MD 02/21/19 [id number]    Veryl Speak, MD 02/21/19 2317

## 2019-02-21 NOTE — ED Notes (Signed)
ED Provider at bedside. 

## 2019-02-21 NOTE — ED Provider Notes (Signed)
I assumed care while patient is waiting transport Pt is awake/alert, answering questions appropriately He is tachypneic but maintaining his airway and appropriate oxygenation Will continue to monitor    Ripley Fraise, MD 02/21/19 2325

## 2019-02-21 NOTE — ED Triage Notes (Signed)
Pt c/o SOB x 3 days, amb from registration to triage 02 sat 71.

## 2019-02-21 NOTE — ED Notes (Signed)
MD aware of PT and condition

## 2019-02-21 NOTE — ED Notes (Signed)
[facility] notified (Tara) - patient ready for transport 

## 2019-02-21 NOTE — ED Notes (Signed)
Contacted Dr. Roel Cluck @ Dr. Estanislado Pandy request @ [phone]

## 2019-02-21 NOTE — ED Notes (Signed)
Provider to update daughter Elmyra Ricks via phone call

## 2019-02-21 NOTE — ED Notes (Signed)
Extensively updated daughter on patient status and results. Confirmed correct call back number and informed daughter I would call back with any updates.

## 2019-02-22 ENCOUNTER — Inpatient Hospital Stay (HOSPITAL_COMMUNITY): Payer: Medicare Other

## 2019-02-22 ENCOUNTER — Encounter (HOSPITAL_COMMUNITY): Payer: Self-pay | Admitting: Internal Medicine

## 2019-02-22 DIAGNOSIS — N289 Disorder of kidney and ureter, unspecified: Secondary | ICD-10-CM

## 2019-02-22 DIAGNOSIS — J189 Pneumonia, unspecified organism: Secondary | ICD-10-CM

## 2019-02-22 DIAGNOSIS — J9601 Acute respiratory failure with hypoxia: Secondary | ICD-10-CM

## 2019-02-22 DIAGNOSIS — Z20822 Contact with and (suspected) exposure to covid-19: Secondary | ICD-10-CM

## 2019-02-22 LAB — RESPIRATORY PANEL BY PCR

## 2019-02-22 LAB — COMPREHENSIVE METABOLIC PANEL
ALT: 21 U/L (ref 0–44)
AST: 26 U/L (ref 15–41)
Albumin: 2.9 g/dL — ABNORMAL LOW (ref 3.5–5.0)
Alkaline Phosphatase: 68 U/L (ref 38–126)
Anion gap: 12 (ref 5–15)
BUN: 33 mg/dL — ABNORMAL HIGH (ref 8–23)
CO2: 25 mmol/L (ref 22–32)
Calcium: 9.2 mg/dL (ref 8.9–10.3)
Chloride: 113 mmol/L — ABNORMAL HIGH (ref 98–111)
Creatinine, Ser: 1.49 mg/dL — ABNORMAL HIGH (ref 0.61–1.24)
GFR calc Af Amer: 54 mL/min — ABNORMAL LOW (ref >60)
GFR calc non Af Amer: 47 mL/min — ABNORMAL LOW (ref >60)
Glucose, Bld: 162 mg/dL — ABNORMAL HIGH (ref 70–99)
Potassium: 4.3 mmol/L (ref 3.5–5.1)
Sodium: 150 mmol/L — ABNORMAL HIGH (ref 135–145)
Total Bilirubin: 1 mg/dL (ref 0.3–1.2)
Total Protein: 7.5 g/dL (ref 6.5–8.1)

## 2019-02-22 LAB — C-REACTIVE PROTEIN
CRP: 9.8 mg/dL — ABNORMAL HIGH (ref <1.0)
CRP: 7.6 mg/dL — ABNORMAL HIGH (ref <1.0)

## 2019-02-22 LAB — PROCALCITONIN: Procalcitonin: <0.1 ng/mL

## 2019-02-22 LAB — URINALYSIS, ROUTINE W REFLEX MICROSCOPIC
Bilirubin Urine: NEGATIVE
Glucose, UA: 50 mg/dL — AB
Ketones, ur: NEGATIVE mg/dL
Leukocytes,Ua: NEGATIVE
Nitrite: NEGATIVE
Protein, ur: 30 mg/dL — AB
Specific Gravity, Urine: 1.033 — ABNORMAL HIGH (ref 1.005–1.030)
pH: 5 (ref 5.0–8.0)

## 2019-02-22 LAB — GLUCOSE, CAPILLARY
Glucose-Capillary: 235 mg/dL — ABNORMAL HIGH (ref 70–99)
Glucose-Capillary: 163 mg/dL — ABNORMAL HIGH (ref 70–99)
Glucose-Capillary: 182 mg/dL — ABNORMAL HIGH (ref 70–99)

## 2019-02-22 LAB — INFLUENZA PANEL BY PCR (TYPE A & B)
Influenza A By PCR: NEGATIVE
Influenza B By PCR: NEGATIVE

## 2019-02-22 LAB — CBC
HCT: 41.1 % (ref 39.0–52.0)
Hemoglobin: 13.1 g/dL (ref 13.0–17.0)
MCH: 26.6 pg (ref 26.0–34.0)
MCHC: 31.9 g/dL (ref 30.0–36.0)
MCV: 83.5 fL (ref 80.0–100.0)
Platelets: 362 10*3/uL (ref 150–400)
RBC: 4.92 MIL/uL (ref 4.22–5.81)
RDW: 14.6 % (ref 11.5–15.5)
WBC: 8.5 10*3/uL (ref 4.0–10.5)
nRBC: 0.4 % — ABNORMAL HIGH (ref 0.0–0.2)

## 2019-02-22 LAB — TROPONIN I (HIGH SENSITIVITY): Troponin I (High Sensitivity): 12 ng/L (ref <18)

## 2019-02-22 LAB — STREP PNEUMONIAE URINARY ANTIGEN: Strep Pneumo Urinary Antigen: NEGATIVE

## 2019-02-22 LAB — D-DIMER, QUANTITATIVE: D-Dimer, Quant: >20 ug/mL-FEU — ABNORMAL HIGH (ref 0.00–0.50)

## 2019-02-22 LAB — TRIGLYCERIDES: Triglycerides: 203 mg/dL — ABNORMAL HIGH (ref <150)

## 2019-02-22 LAB — MRSA PCR SCREENING: MRSA by PCR: NEGATIVE

## 2019-02-22 LAB — ABO/RH: ABO/RH(D): O POS

## 2019-02-22 LAB — FERRITIN
Ferritin: 1854 ng/mL — ABNORMAL HIGH (ref 24–336)
Ferritin: 1554 ng/mL — ABNORMAL HIGH (ref 24–336)

## 2019-02-22 LAB — LACTIC ACID, PLASMA
Lactic Acid, Venous: 1.5 mmol/L (ref 0.5–1.9)
Lactic Acid, Venous: 1.9 mmol/L (ref 0.5–1.9)

## 2019-02-22 LAB — SARS CORONAVIRUS 2 (TAT 6-24 HRS): SARS Coronavirus 2: NEGATIVE

## 2019-02-22 LAB — FIBRINOGEN: Fibrinogen: >800 mg/dL — ABNORMAL HIGH (ref 210–475)

## 2019-02-22 LAB — LACTATE DEHYDROGENASE: LDH: 450 U/L — ABNORMAL HIGH (ref 98–192)

## 2019-02-22 MED ORDER — INSULIN ASPART 100 UNIT/ML ~~LOC~~ SOLN
0.0000 [IU] | SUBCUTANEOUS | Status: DC
  Administered 2019-02-22 (×2): 2 [IU] via SUBCUTANEOUS
  Administered 2019-02-22: 1 [IU] via SUBCUTANEOUS
  Administered 2019-02-22: 3 [IU] via SUBCUTANEOUS
  Administered 2019-02-22 – 2019-02-23 (×2): 2 [IU] via SUBCUTANEOUS
  Administered 2019-02-23: 1 [IU] via SUBCUTANEOUS
  Administered 2019-02-23 (×3): 2 [IU] via SUBCUTANEOUS

## 2019-02-22 MED ORDER — SODIUM CHLORIDE 0.9 % IV SOLN
100.0000 mg | Freq: Every day | INTRAVENOUS | Status: AC
  Administered 2019-02-23 – 2019-02-26 (×4): 100 mg via INTRAVENOUS
  Filled 2019-02-22 (×4): qty 20

## 2019-02-22 MED ORDER — VANCOMYCIN HCL 1750 MG/350ML IV SOLN
1750.0000 mg | INTRAVENOUS | Status: DC
  Filled 2019-02-22: qty 350

## 2019-02-22 MED ORDER — SODIUM CHLORIDE 0.9 % IV SOLN
500.0000 mg | Freq: Every day | INTRAVENOUS | Status: DC
  Administered 2019-02-22: 500 mg via INTRAVENOUS
  Filled 2019-02-22 (×2): qty 500

## 2019-02-22 MED ORDER — ORAL CARE MOUTH RINSE
15.0000 mL | Freq: Two times a day (BID) | OROMUCOSAL | Status: DC
  Administered 2019-02-22 – 2019-02-28 (×9): 15 mL via OROMUCOSAL

## 2019-02-22 MED ORDER — SODIUM CHLORIDE 0.9 % IV SOLN
2.0000 g | Freq: Every day | INTRAVENOUS | Status: DC
  Administered 2019-02-22 – 2019-02-25 (×4): 2 g via INTRAVENOUS
  Filled 2019-02-22: qty 2
  Filled 2019-02-22 (×2): qty 20
  Filled 2019-02-22: qty 2
  Filled 2019-02-22: qty 20

## 2019-02-22 MED ORDER — TAMSULOSIN HCL 0.4 MG PO CAPS
0.4000 mg | ORAL_CAPSULE | Freq: Every day | ORAL | Status: DC
  Administered 2019-02-22 – 2019-02-28 (×7): 0.4 mg via ORAL
  Filled 2019-02-22 (×7): qty 1

## 2019-02-22 MED ORDER — ENOXAPARIN SODIUM 40 MG/0.4ML ~~LOC~~ SOLN: 40.0000 mg | Freq: Every day | SUBCUTANEOUS | Status: DC

## 2019-02-22 MED ORDER — ENOXAPARIN SODIUM 40 MG/0.4ML ~~LOC~~ SOLN
40.0000 mg | Freq: Two times a day (BID) | SUBCUTANEOUS | Status: DC
  Administered 2019-02-22: 40 mg via SUBCUTANEOUS

## 2019-02-22 MED ORDER — ASPIRIN 81 MG PO CHEW
81.0000 mg | CHEWABLE_TABLET | Freq: Every day | ORAL | Status: DC
  Administered 2019-02-22 – 2019-02-28 (×7): 81 mg via ORAL
  Filled 2019-02-22 (×7): qty 1

## 2019-02-22 MED ORDER — DEXAMETHASONE SODIUM PHOSPHATE 10 MG/ML IJ SOLN
6.0000 mg | INTRAMUSCULAR | Status: DC
  Administered 2019-02-22 – 2019-02-27 (×6): 6 mg via INTRAVENOUS
  Filled 2019-02-22 (×6): qty 1

## 2019-02-22 MED ORDER — SODIUM CHLORIDE 0.9 % IV SOLN
200.0000 mg | Freq: Once | INTRAVENOUS | Status: AC
  Administered 2019-02-22: 200 mg via INTRAVENOUS
  Filled 2019-02-22: qty 200

## 2019-02-22 MED ORDER — ATORVASTATIN CALCIUM 40 MG PO TABS
80.0000 mg | ORAL_TABLET | Freq: Every day | ORAL | Status: DC
  Administered 2019-02-22 – 2019-02-28 (×7): 80 mg via ORAL
  Filled 2019-02-22 (×7): qty 2

## 2019-02-22 MED ORDER — ENOXAPARIN SODIUM 100 MG/ML ~~LOC~~ SOLN
100.0000 mg | Freq: Two times a day (BID) | SUBCUTANEOUS | Status: DC
  Administered 2019-02-22 – 2019-02-24 (×4): 100 mg via SUBCUTANEOUS
  Filled 2019-02-22 (×4): qty 1

## 2019-02-22 MED ORDER — CHLORHEXIDINE GLUCONATE 0.12 % MT SOLN
15.0000 mL | Freq: Two times a day (BID) | OROMUCOSAL | Status: DC
  Administered 2019-02-22 – 2019-02-28 (×13): 15 mL via OROMUCOSAL
  Filled 2019-02-22 (×14): qty 15

## 2019-02-22 MED ORDER — LATANOPROST 0.005 % OP SOLN
1.0000 [drp] | Freq: Every day | OPHTHALMIC | Status: DC
  Administered 2019-02-22 – 2019-02-27 (×5): 1 [drp] via OPHTHALMIC
  Filled 2019-02-22: qty 2.5

## 2019-02-22 MED ORDER — TIMOLOL MALEATE 0.5 % OP SOLN
1.0000 [drp] | Freq: Two times a day (BID) | OPHTHALMIC | Status: DC
  Administered 2019-02-22 – 2019-02-28 (×12): 1 [drp] via OPHTHALMIC
  Filled 2019-02-22: qty 5

## 2019-02-22 MED ORDER — ATENOLOL 25 MG PO TABS
25.0000 mg | ORAL_TABLET | Freq: Every day | ORAL | Status: DC
  Administered 2019-02-22 – 2019-02-28 (×7): 25 mg via ORAL
  Filled 2019-02-22 (×7): qty 1

## 2019-02-22 MED ORDER — CHLORHEXIDINE GLUCONATE CLOTH 2 % EX PADS
6.0000 | MEDICATED_PAD | Freq: Every day | CUTANEOUS | Status: DC
  Administered 2019-02-23 – 2019-02-28 (×5): 6 via TOPICAL

## 2019-02-22 NOTE — ED Notes (Signed)
Daughter Elmyra Ricks updated

## 2019-02-22 NOTE — H&P (Signed)
TRH H&P    Patient Demographics:    Jerry Caldwell, is a 74 y.o. male  MRN: [id number]  DOB - [birth year]  Admit Date - 02/21/2019  Referring MD/NP/PA:  Ripley Fraise  Outpatient Primary MD for the patient is Tisovec, Fransico Him, MD  Patient coming from: home -[place]  Chief complaint- dyspnea   HPI:    Jerry Caldwell  is a 74 y.o. male, w hypertension, hyperlipidemia, Dm2, Bph, OSA presents with dyspnea x 3 days.  Apparently his wife recently died of pulmonary embolism in the past week and was covid-19 positive. Pt notes slight cough, dry, but  denies fever, chills, cp, palp , n/v, diarrhea, brbpr, black stool, headache, alteration in taste and smell.   In ED,  T 98.9 P 130, R 30, Bp 126/91  Pox 77% on RA Wt 95.3kg  CTA chest IMPRESSION: 1. Marked severity diffuse bilateral infiltrates. 2. Area of low attenuation within the right kidney. While this may epresent a renal cyst, correlation with renal ultrasound is recommended to exclude the presence of a soft tissue component.  Na 148, K 3.7,  Bun 27, Creatinine 1.41 Ast 39, Alt 24 Wbc 7.7, hgb 14.9, Plt 362 Trop 14 BNP 43.4 Lactic acid 3.0 > 1.5 LDH 450 Ferritin 1,854 Crp 9.8  Pt will be admitted for sepsis secondary to pneumonia r/o covid-19 or other viral pathology.       Review of systems:    In addition to the HPI above,  No Fever-chills, No Headache, No changes with Vision or hearing, No problems swallowing food or Liquids, No Chest pain,  No Abdominal pain, No Nausea or Vomiting, bowel movements are regular, No Blood in stool or Urine, No dysuria, No new skin rashes or bruises, No new joints pains-aches,  No new weakness, tingling, numbness in any extremity, No recent weight gain or loss, No polyuria, polydypsia or polyphagia, No significant Mental Stressors.  All other systems reviewed and are negative.     Past History of the following :    Past Medical History:  Diagnosis Date  . Allergy   . Arthritis    right knee   . Diabetes ([place])   . Diverticulosis   . Glaucoma   . HTN (hypertension)   . Hyperlipidemia   . Hypogonadism in male   . Migraines   . Sleep apnea    uses cpap       Past Surgical History:  Procedure Laterality Date  . ANTERIOR CRUCIATE LIGAMENT REPAIR  1992  . COLONOSCOPY  03/31/2006   Diverticulosis  . ROTATOR CUFF REPAIR Right 2008      Social History:      Social History   Tobacco Use  . Smoking status: Former Research scientist (life sciences)  . Smokeless tobacco: Never Used  Substance Use Topics  . Alcohol use: No       Family History :     Family History  Problem Relation Age of Onset  . Glaucoma Mother   . Cancer - Lung Mother   . Diabetes  type II Maternal Aunt   . Diabetes Sister   . Colon cancer Neg Hx   . Colon polyps Neg Hx   . Esophageal cancer Neg Hx   . Rectal cancer Neg Hx   . Stomach cancer Neg Hx        Home Medications:   Prior to Admission medications   Medication Sig Start Date End Date Taking? Authorizing Provider  aspirin 81 MG chewable tablet Chew 81 mg by mouth daily.   Yes [provider]  atenolol (TENORMIN) 25 MG tablet Take 25 mg by mouth daily.   Yes [provider]  atorvastatin (LIPITOR) 80 MG tablet Take 80 mg by mouth daily.   Yes [provider]  fluticasone (FLONASE) 50 MCG/ACT nasal spray Place 1 spray into both nostrils daily as needed for allergies or rhinitis.   Yes [provider]  hydrochlorothiazide (HYDRODIURIL) 25 MG tablet Take 25 mg by mouth daily.   Yes [provider]  KRILL OIL PO Take 1 capsule by mouth daily.   Yes [provider]  latanoprost (XALATAN) 0.005 % ophthalmic solution Place 1 drop into both eyes at bedtime.  07/27/13  Yes [provider]  losartan (COZAAR) 50 MG tablet Take 50 mg by mouth daily.   Yes [provider]  metFORMIN  (GLUCOPHAGE) 500 MG tablet Take 500 mg by mouth daily.   Yes [provider]  naproxen (NAPROSYN) 500 MG tablet Take 500 mg by mouth daily as needed for mild pain.    Yes [provider]  tamsulosin (FLOMAX) 0.4 MG CAPS capsule Take 0.4 mg by mouth daily.   Yes [provider]  timolol (TIMOPTIC) 0.5 % ophthalmic solution Place 1 drop into both eyes 2 (two) times daily.  08/08/13  Yes [provider]     Allergies:     Allergies  Allergen Reactions  . Lisinopril Cough     Physical Exam:   Vitals  Blood pressure (!) 142/79, pulse 95, temperature 98 F (36.7 C), temperature source Oral, resp. rate (!) 29, height '6\' 1"'  (1.854 m), weight 97 kg, SpO2 94 %.  1.  General: Axoxo3, thin elderly gentleman  2. Psychiatric: euthymic  3. Neurologic: nonfocal  4. HEENMT:  Anicteric,  Pupils 1.10m symmetric, direct, consensual intact Neck: no jvd  5. Respiratory : Crackles in bilateral bases, no wheezing  6. Cardiovascular : rrr s1, s2, no m/g/r  7. Gastrointestinal:  Abd: soft, nt, nd, +bs  8. Skin:  Ext: no c/c/e, no rash  9.Musculoskeletal:  Good ROM    Data Review:    CBC Recent Labs  Lab 02/21/19 1816 02/21/19 2226  WBC 7.7  --   HGB 14.9 12.9*  HCT 46.2 38.0*  PLT 362  --   MCV 81.9  --   MCH 26.4  --   MCHC 32.3  --   RDW 14.4  --   LYMPHSABS 0.9  --   MONOABS 0.6  --   EOSABS 0.0  --   BASOSABS 0.0  --    ------------------------------------------------------------------------------------------------------------------  Results for orders placed or performed during the hospital encounter of 02/21/19 (from the past 48 hour(s))  Blood culture (routine x 2)     Status: None (Preliminary result)   Collection Time: 02/21/19  6:10 PM   Specimen: BLOOD  Result Value Ref Range   Specimen Description      BLOOD LEFT ANTECUBITAL Performed at [facility] [address], [place]  [place] [address]    Special  Requests      BOTTLES DRAWN AEROBIC AND ANAEROBIC Blood Culture results may not be optimal due to an inadequate volume of blood received in culture bottles Performed at [facility] [address], [place], [place] [address]    Culture PENDING    Report Status PENDING   Comprehensive metabolic panel     Status: Abnormal   Collection Time: 02/21/19  6:16 PM  Result Value Ref Range   Sodium 148 (H) 135 - 145 mmol/L   Potassium 3.7 3.5 - 5.1 mmol/L   Chloride 112 (H) 98 - 111 mmol/L   CO2 24 22 - 32 mmol/L   Glucose, Bld 187 (H) 70 - 99 mg/dL   BUN 27 (H) 8 - 23 mg/dL   Creatinine, Ser 1.41 (H) 0.61 - 1.24 mg/dL   Calcium 9.6 8.9 - 10.3 mg/dL   Total Protein 8.6 (H) 6.5 - 8.1 g/dL   Albumin 3.2 (L) 3.5 - 5.0 g/dL   AST 39 15 - 41 U/L   ALT 24 0 - 44 U/L   Alkaline Phosphatase 76 38 - 126 U/L   Total Bilirubin 1.2 0.3 - 1.2 mg/dL   GFR calc non Af Amer 50 (L) >60 mL/min   GFR calc Af Amer 58 (L) >60 mL/min   Anion gap 12 5 - 15    Comment: Performed at [facility], [address] [place], [place] [address]  Brain natriuretic peptide     Status: None   Collection Time: 02/21/19  6:16 PM  Result Value Ref Range   B Natriuretic Peptide 43.4 0.0 - 100.0 pg/mL    Comment: Performed at [facility], [address] [place], [place] [address]  Troponin I (High Sensitivity)     Status: None   Collection Time: 02/21/19  6:16 PM  Result Value Ref Range   Troponin I (High Sensitivity) 14 <18 ng/L    Comment: (NOTE) Elevated high sensitivity troponin I (hsTnI) values and significant  changes across serial measurements may suggest ACS but many other  chronic and acute conditions are known to elevate hsTnI results.  Refer to the "Links" section for chest pain algorithms and additional  guidance. Performed at [facility], [address] [place], [place] [address]   CBC with Differential     Status: Abnormal   Collection Time: 02/21/19  6:16 PM   Result Value Ref Range   WBC 7.7 4.0 - 10.5 K/uL   RBC 5.64 4.22 - 5.81 MIL/uL   Hemoglobin 14.9 13.0 - 17.0 g/dL   HCT 46.2 39.0 - 52.0 %   MCV 81.9 80.0 - 100.0 fL   MCH 26.4 26.0 - 34.0 pg   MCHC 32.3 30.0 - 36.0 g/dL   RDW 14.4 11.5 - 15.5 %   Platelets 362 150 - 400 K/uL   nRBC 0.5 (H) 0.0 - 0.2 %   Neutrophils Relative % 79 %   Neutro Abs 6.1 1.7 - 7.7 K/uL   Lymphocytes Relative 12 %   Lymphs Abs 0.9 0.7 - 4.0 K/uL   Monocytes Relative 8 %   Monocytes Absolute 0.6 0.1 - 1.0 K/uL   Eosinophils Relative 0 %   Eosinophils Absolute 0.0 0.0 - 0.5 K/uL   Basophils Relative 0 %   Basophils Absolute 0.0 0.0 - 0.1 K/uL   Immature Granulocytes 1 %   Abs Immature Granulocytes 0.07 0.00 - 0.07  K/uL   Reactive, Benign Lymphocytes PRESENT     Comment: Performed at [facility], [address] [place], [place] [address]  SARS Coronavirus 2 Ag (30 min TAT) - Nasal Swab (BD Veritor Kit)     Status: None   Collection Time: 02/21/19  6:16 PM   Specimen: Nasal Swab (BD Veritor Kit)  Result Value Ref Range   SARS Coronavirus 2 Ag NEGATIVE NEGATIVE    Comment: (NOTE) SARS-CoV-2 antigen NOT DETECTED.  Negative results are presumptive.  Negative results do not preclude SARS-CoV-2 infection and should not be used as the sole basis for treatment or other patient management decisions, including infection  control decisions, particularly in the presence of clinical signs and  symptoms consistent with COVID-19, or in those who have been in contact with the virus.  Negative results must be combined with clinical observations, patient history, and epidemiological information. The expected result is Negative. Fact Sheet for Patients: PodPark.tn Fact Sheet for Healthcare Providers: GiftContent.is This test is not yet approved or cleared by the [place] FDA and  has been authorized for detection and/or diagnosis of  SARS-CoV-2 by FDA under an Emergency Use Authorization (EUA).  This EUA will remain in effect (meaning this test can be used) for the duration of  the COVID-19 de claration under Section 564(b)(1) of the Act, 21 U.S.C. section 360bbb-3(b)(1), unless the authorization is terminated or revoked sooner. Performed at [facility], [address] [place], [place] [address]   Lactic acid, plasma     Status: Abnormal   Collection Time: 02/21/19  6:16 PM  Result Value Ref Range   Lactic Acid, Venous 3.0 (HH) 0.5 - 1.9 mmol/L    Comment: CRITICAL RESULT CALLED TO, READ BACK BY AND VERIFIED WITH: ADKINS,L AT 2238 ON [id number] BY CHERESNOWSKY,T Performed at [facility], [address] [place], [place] [address]   D-dimer, quantitative     Status: Abnormal   Collection Time: 02/21/19  6:16 PM  Result Value Ref Range   D-Dimer, Quant 6.57 (H) 0.00 - 0.50 ug/mL-FEU    Comment: (NOTE) At the manufacturer cut-off of 0.50 ug/mL FEU, this assay has been documented to exclude PE with a sensitivity and negative predictive value of 97 to 99%.  At this time, this assay has not been approved by the FDA to exclude DVT/VTE. Results should be correlated with clinical presentation. Performed at [facility], [address] [place], [place] [address]   Procalcitonin     Status: None   Collection Time: 02/21/19  6:16 PM  Result Value Ref Range   Procalcitonin <0.10 ng/mL    Comment:        Interpretation: PCT (Procalcitonin) <= 0.5 ng/mL: Systemic infection (sepsis) is not likely. Local bacterial infection is possible. (NOTE)       Sepsis PCT Algorithm           Lower Respiratory Tract                                      Infection PCT Algorithm    ----------------------------     ----------------------------         PCT < 0.25 ng/mL                PCT < 0.10 ng/mL         Strongly encourage  Strongly discourage   discontinuation of antibiotics     initiation of antibiotics    ----------------------------     -----------------------------       PCT 0.25 - 0.50 ng/mL            PCT 0.10 - 0.25 ng/mL               OR       >80% decrease in PCT            Discourage initiation of                                            antibiotics      Encourage discontinuation           of antibiotics    ----------------------------     -----------------------------         PCT >= 0.50 ng/mL              PCT 0.26 - 0.50 ng/mL               AND        <80% decrease in PCT             Encourage initiation of                                             antibiotics       Encourage continuation           of antibiotics    ----------------------------     -----------------------------        PCT >= 0.50 ng/mL                  PCT > 0.50 ng/mL               AND         increase in PCT                  Strongly encourage                                      initiation of antibiotics    Strongly encourage escalation           of antibiotics                                     -----------------------------                                           PCT <= 0.25 ng/mL                                                 OR                                        >  80% decrease in PCT                                     Discontinue / Do not initiate                                             antibiotics Performed at [facility], [place] [address] [place], [place] [address]   Lactate dehydrogenase     Status: Abnormal   Collection Time: 02/21/19  6:16 PM  Result Value Ref Range   LDH 450 (H) 98 - 192 U/L    Comment: Performed at [facility] [address] [place], [place] [address]  Ferritin     Status: Abnormal   Collection Time: 02/21/19  6:16 PM  Result Value Ref Range   Ferritin 1,854 (H) 24 - 336 ng/mL    Comment: Performed at [facility], [place] [address] [place], [place] [address]  Triglycerides     Status: Abnormal    Collection Time: 02/21/19  6:16 PM  Result Value Ref Range   Triglycerides 203 (H) <150 mg/dL    Comment: Performed at [facility] [address] [place], [place] [address]  Fibrinogen     Status: Abnormal   Collection Time: 02/21/19  6:16 PM  Result Value Ref Range   Fibrinogen >800 (H) 210 - 475 mg/dL    Comment: REPEATED TO VERIFY Performed at [facility] [address], [place], [place] [address]   C-reactive protein     Status: Abnormal   Collection Time: 02/21/19  6:16 PM  Result Value Ref Range   CRP 9.8 (H) <1.0 mg/dL    Comment: Performed at [facility] [address] [place], [place] [address]  SARS Coronavirus 2 by RT PCR (hospital order, performed in [facility] hospital lab) Nasopharyngeal Nasopharyngeal Swab     Status: None   Collection Time: 02/21/19  7:07 PM   Specimen: Nasopharyngeal Swab  Result Value Ref Range   SARS Coronavirus 2 NEGATIVE NEGATIVE    Comment: Performed at [facility], [address] [place], [place] [address]  Troponin I (High Sensitivity)     Status: None   Collection Time: 02/21/19  8:18 PM  Result Value Ref Range   Troponin I (High Sensitivity) 16 <18 ng/L    Comment: (NOTE) Elevated high sensitivity troponin I (hsTnI) values and significant  changes across serial measurements may suggest ACS but many other  chronic and acute conditions are known to elevate hsTnI results.  Refer to the "Links" section for chest pain algorithms and additional  guidance. Performed at [facility], [address] [place], [place] [address]   I-STAT 7, (LYTES, BLD GAS, ICA, H+H)     Status: Abnormal   Collection Time: 02/21/19 10:26 PM  Result Value Ref Range   pH, Arterial 7.522 (H) 7.350 - 7.450   pCO2 arterial 31.4 (L) 32.0 - 48.0 mmHg   pO2, Arterial 75.0 (L) 83.0 - 108.0 mmHg   Bicarbonate 25.8 20.0 - 28.0 mmol/L   TCO2 27 22 - 32 mmol/L   O2 Saturation 97.0 %   Acid-Base Excess 3.0 (H) 0.0 - 2.0  mmol/L   Sodium 145 135 -  145 mmol/L   Potassium 3.9 3.5 - 5.1 mmol/L   Calcium, Ion 1.26 1.15 - 1.40 mmol/L   HCT 38.0 (L) 39.0 - 52.0 %   Hemoglobin 12.9 (L) 13.0 - 17.0 g/dL   Patient temperature 97.8 F    Sample type ARTERIAL   Lactic acid, plasma     Status: None   Collection Time: 02/21/19 11:48 PM  Result Value Ref Range   Lactic Acid, Venous 1.5 0.5 - 1.9 mmol/L    Comment: Performed at [facility], [address] [place], [place] [address]    Chemistries  Recent Labs  Lab 02/21/19 1816 02/21/19 2226  NA 148* 145  K 3.7 3.9  CL 112*  --   CO2 24  --   GLUCOSE 187*  --   BUN 27*  --   CREATININE 1.41*  --   CALCIUM 9.6  --   AST 39  --   ALT 24  --   ALKPHOS 76  --   BILITOT 1.2  --    ------------------------------------------------------------------------------------------------------------------  ------------------------------------------------------------------------------------------------------------------ GFR: Estimated Creatinine Clearance: 58.9 mL/min (A) (by C-G formula based on SCr of 1.41 mg/dL (H)). Liver Function Tests: Recent Labs  Lab 02/21/19 1816  AST 39  ALT 24  ALKPHOS 76  BILITOT 1.2  PROT 8.6*  ALBUMIN 3.2*   No results for input(s): LIPASE, AMYLASE in the last 168 hours. No results for input(s): AMMONIA in the last 168 hours. Coagulation Profile: No results for input(s): INR, PROTIME in the last 168 hours. Cardiac Enzymes: No results for input(s): CKTOTAL, CKMB, CKMBINDEX, TROPONINI in the last 168 hours. BNP (last 3 results) No results for input(s): PROBNP in the last 8760 hours. HbA1C: No results for input(s): HGBA1C in the last 72 hours. CBG: No results for input(s): GLUCAP in the last 168 hours. Lipid Profile: Recent Labs    02/21/19 1816  TRIG 203*   Thyroid Function Tests: No results for input(s): TSH, T4TOTAL, FREET4, T3FREE, THYROIDAB in the last 72 hours. Anemia Panel: Recent Labs     02/21/19 1816  FERRITIN 1,854*    --------------------------------------------------------------------------------------------------------------- Urine analysis: No results found for: COLORURINE, APPEARANCEUR, LABSPEC, PHURINE, GLUCOSEU, HGBUR, BILIRUBINUR, KETONESUR, PROTEINUR, UROBILINOGEN, NITRITE, LEUKOCYTESUR    Imaging Results:    CT Angio Chest PE W and/or Wo Contrast  Result Date: 02/21/2019 CLINICAL DATA:  Shortness of breath x3 days. EXAM: CT ANGIOGRAPHY CHEST WITH CONTRAST TECHNIQUE: Multidetector CT imaging of the chest was performed using the standard protocol during bolus administration of intravenous contrast. Multiplanar CT image reconstructions and MIPs were obtained to evaluate the vascular anatomy. CONTRAST:  117m OMNIPAQUE IOHEXOL 350 MG/ML SOLN COMPARISON:  None. FINDINGS: Cardiovascular: Satisfactory opacification of the pulmonary arteries to the segmental level. No evidence of pulmonary embolism. Normal heart size. No pericardial effusion. Mediastinum/Nodes: No enlarged mediastinal, hilar, or axillary lymph nodes. Thyroid gland, trachea, and esophagus demonstrate no significant findings. Lungs/Pleura: Marked severity, predominately ground-glass appearing, infiltrates are seen throughout both lungs. There is no evidence of a pleural effusion or pneumothorax. Upper Abdomen: A 3.5 cm x 3.8 cm area of heterogeneous low attenuation is seen along the lateral aspect of the mid right kidney. Musculoskeletal: No chest wall abnormality. No acute or significant osseous findings. Review of the MIP images confirms the above findings. IMPRESSION: 1. Marked severity diffuse bilateral infiltrates. 2. Area of low attenuation within the right kidney. While this may represent a renal cyst, correlation with renal ultrasound is recommended to exclude the presence  of a soft tissue component. Electronically Signed   By: Virgina Norfolk M.D.   On: 02/21/2019 21:00   DG Chest Port 1 View  Result  Date: 02/21/2019 CLINICAL DATA:  Shortness of breath EXAM: PORTABLE CHEST 1 VIEW COMPARISON:  None. FINDINGS: The heart size and mediastinal contours are within normal limits. Mild hazy/ground-glass opacity seen within the bilateral lower lungs. No large airspace consolidation or pleural effusion. IMPRESSION: Mild hazy/ground-glass opacity at both lung bases which could be due to atelectasis and/or early infectious etiology. Electronically Signed   By: Prudencio Pair M.D.   On: 02/21/2019 19:33   ekg at at 122, nl axis, early R progression, no st-t changes c/w ischemia     Assessment & Plan:    Active Problems:   Suspected COVID-19 virus infection   Acute respiratory failure with hypoxia (HCC)  Acute respiratory failure with hypoxia CAP vs covid-19  CAP  Blood culture x2 Urine strep antigen Urine legionella antigen Vanco iv pharmacy to dose Rocephin 2gm iv qday Zithromax 520m iv qday Check cbc in am  Presumed covid-19 infection Repeat covid-19 pcr along with respiratory viral panel, influenza pcr Dexamethasone 659miv q day Start Remdesivir pharmacy consult   Hypernatremia Hydrate with 1/2 ns iv Check cmp in am  ARF Check renal ultrasound Hydrate with 1/2 ns iv  Dm2 fsbs ac and qhs, ISS  Hypertension Cont Atenolol 2516mo qday Hold  Hydrochlorothiazide 22m32m qday Hold  Losartan 50mg20mqday  Hyperlipidemia Cont Lipitor 80mg 2mhs  Bph Cont Flomax  Glaucoma Cont Timoptic Cont Xalatan     DVT Prophylaxis-   Lovenox - SCDs   AM Labs Ordered, also please review Full Orders  Family Communication: Admission, patients condition and plan of care including tests being ordered have been discussed with the patient who indicate understanding and agree with the plan and Code Status.  Code Status:  FULL CODE per patient, discussed plan with daughter  Admission status:  Inpatient: Based on patients clinical presentation and evaluation of above clinical data, I have  made determination that patient meets Inpatient criteria at this time.  Pt has acute respiratory failure with hypoxia, secondary, to pneumonia, r/o covid.   Time spent in minutes : 70 minutes   Laurier Jasperson Jani Graveln 02/22/2019 at 3:03 AM

## 2019-02-22 NOTE — ED Notes (Signed)
Attempted to call report on ready bed-left call back number for RN to call back

## 2019-02-22 NOTE — Plan of Care (Signed)

## 2019-02-22 NOTE — ED Notes (Addendum)
Contacted EMS for transport -- [facility] has other patients ahead of him

## 2019-02-22 NOTE — ED Provider Notes (Signed)
Patient maintaining his oxygenation.  Patient resting comfortably, easily arousable following commands.    Ripley Fraise, MD 02/22/19 [phone]

## 2019-02-22 NOTE — Progress Notes (Signed)
Pharmacy Antibiotic Note  Jerry Caldwell. is a 74 y.o. male admitted on 02/21/2019 with pneumonia.  Pharmacy has been consulted for vancomycin dosing.  Plan: Vancomycin 1 Gm x1 then 1750 mg IV q24h for est AUC = 506 Use scr = 1.41 Goal AUC = 400-550 F/u scr/cultures  Height: 6\' 1"  (185.4 cm) Weight: 213 lb 13.5 oz (97 kg) IBW/kg (Calculated) : 79.9  Temp (24hrs), Avg:98.5 F (36.9 C), Min:98 F (36.7 C), Max:98.9 F (37.2 C)  Recent Labs  Lab 02/21/19 1816 02/21/19 2348  WBC 7.7  --   CREATININE 1.41*  --   LATICACIDVEN 3.0* 1.5    Estimated Creatinine Clearance: 58.9 mL/min (A) (by C-G formula based on SCr of 1.41 mg/dL (H)).    Allergies  Allergen Reactions  . Lisinopril Cough    Antimicrobials this admission: 2/1 vancomycin >>   2/2 roc/zmax  >>   Dose adjustments this admission:   Microbiology results:  BCx:   UCx:    Sputum:    MRSA PCR:   Thank you for allowing pharmacy to be a part of this patient's care.  Dorrene German 02/22/2019 3:12 AM

## 2019-02-22 NOTE — Progress Notes (Addendum)
Called and updated Joseph Art (patient's daughter, that brought him to the ER), after patient's daughter called and requested update. Answered patient's daughter Dametri Drane) questions, and gave general update to current status.  Both daughter's encouraged to contact case management in the morning regarding medical/POA status in regard to whom is going to be making decisions while the patient is unable.

## 2019-02-22 NOTE — Progress Notes (Signed)
PROGRESS NOTE    Jerry Caldwell.  HAF:[id number] DOB: May 18, 1945 DOA: 02/21/2019 PCP: Haywood Pao, MD    Brief Narrative:  74 y.o. male, w hypertension, hyperlipidemia, Dm2, Bph, OSA presents with dyspnea x 3 days.  Apparently his wife recently died of pulmonary embolism in the past week and was covid-19 positive. Pt notes slight cough, dry, but  denies fever, chills, cp, palp , n/v, diarrhea, brbpr, black stool, headache, alteration in taste and smell.   In ED,  T 98.9 P 130, R 30, Bp 126/91  Pox 77% on RA Wt 95.3kg  Assessment & Plan:   Principal Problem:   Acute respiratory failure with hypoxia (HCC) Active Problems:   Suspected COVID-19 virus infection   Pneumonia   Acute respiratory failure with hypoxia CAP vs likely covid-19  CAP  -Blood culture x2 pending -Urine strep and legionella antigens pending -MRSA swab neg, thus vanocmycin discontinued -On empiric Rocephin 2gm iv qday and Zithromax 559m iv qday -If covid returns positive, would hold further abx as procal is <0.1  Presumed covid-19 infection -COVID antigen neg -COVID RNA pending -See history. Pt has exposure to covid, with his wife recently passing from CIrvingrelated complications thus chance for COVID pna is high -CRP elevated at 9.8 with neg procal -Already on empiric remdesivir and decadron -Ddimer of 6.57. CTA chest reviewed, neg for PE but does confirm marked diffuse B infiltrates -currently on 15L high flow O2 -If covid RNA test is positive, [facility]  Hypernatremia -Suspect related to dehydration -Hydrate with 1/2 ns iv -Repeat bmet in AM  ARF -Renal UKoreareviewed. Findings of medical renal disease -Cont hydration with 1/2 ns iv  Dm2 -glucose presently stable -Continue SSI coverage as needed  Hypertension -Cont Atenolol 256mpo qday as tolerated -Holding  Hydrochlorothiazide 2568mo qday given concerns of dehydration -Holding   Losartan 30m18m qday given ARF  Hyperlipidemia -Cont Lipitor 80mg15mqhs as tolerated  Bph -Cont Flomax as tolerated -Voiding well this AM with condom cath  Glaucoma -Cont Timoptic and Xalatan as tolerated  DVT prophylaxis: Lovenox subq Code Status: Full Family Communication: Pt in room, family not at bedside Disposition Plan: Uncertain at this time, pending result of covid testing. Possible tx to [facility]  Consultants:     Procedures:     Antimicrobials: Anti-infectives (From admission, onward)   Start     Dose/Rate Route Frequency Ordered Stop   02/23/19 1000  remdesivir 100 mg in sodium chloride 0.9 % 100 mL IVPB     100 mg 200 mL/hr over 30 Minutes Intravenous Daily 02/22/19 0721 02/27/19 0959   02/22/19 1000  remdesivir 200 mg in sodium chloride 0.9% 250 mL IVPB     200 mg 580 mL/hr over 30 Minutes Intravenous Once 02/22/19 0721     02/22/19 0800  vancomycin (VANCOREADY) IVPB 1750 mg/350 mL  Status:  Discontinued     1,750 mg 175 mL/hr over 120 Minutes Intravenous Every 24 hours 02/22/19 0311 02/22/19 0733   02/22/19 0400  cefTRIAXone (ROCEPHIN) 2 g in sodium chloride 0.9 % 100 mL IVPB     2 g 200 mL/hr over 30 Minutes Intravenous Daily 02/22/19 0256     02/22/19 0300  azithromycin (ZITHROMAX) 500 mg in sodium chloride 0.9 % 250 mL IVPB     500 mg 250 mL/hr over 60 Minutes Intravenous Daily 02/22/19 0256     02/21/19 2130  vancomycin (VANCOCIN) IVPB 1000 mg/200  mL premix     1,000 mg 200 mL/hr over 60 Minutes Intravenous  Once 02/21/19 2118 02/22/19 0007   02/21/19 2130  piperacillin-tazobactam (ZOSYN) IVPB 3.375 g     3.375 g 12.5 mL/hr over 240 Minutes Intravenous  Once 02/21/19 2118 02/21/19 2300       Subjective: Complains of continued sob this AM  Objective: Vitals:   02/22/19 0500 02/22/19 0600 02/22/19 0700 02/22/19 0830  BP: 120/84 137/85 131/89   Pulse: 89 92 93   Resp: (!) 25 18 (!) 29   Temp:    97.9 F (36.6 C)  TempSrc:    Oral   SpO2: 96% 93% 94%   Weight:      Height:        Intake/Output Summary (Last 24 hours) at 02/22/2019 1233 Last data filed at 02/22/2019 9735 Gross per 24 hour  Intake 586.98 ml  Output 250 ml  Net 336.98 ml   Filed Weights   02/21/19 1758 02/22/19 0225  Weight: 95.3 kg 97 kg    Examination:  General exam: Appears calm and comfortable  Respiratory system: No audible wheezing, increased resp effortl. Cardiovascular system: S1 & S2 heard, Regular Gastrointestinal system: Abdomen is nondistended, soft and nontender. No organomegaly or masses felt. Normal bowel sounds heard. Central nervous system: Alert and oriented. No focal neurological deficits. Extremities: Symmetric 5 x 5 power. Skin: No rashes, lesions  Psychiatry: Judgement and insight appear normal. Mood & affect appropriate.   Data Reviewed: I have personally reviewed following labs and imaging studies  CBC: Recent Labs  Lab 02/21/19 1816 02/21/19 2226  WBC 7.7  --   NEUTROABS 6.1  --   HGB 14.9 12.9*  HCT 46.2 38.0*  MCV 81.9  --   PLT 362  --    Basic Metabolic Panel: Recent Labs  Lab 02/21/19 1816 02/21/19 2226  NA 148* 145  K 3.7 3.9  CL 112*  --   CO2 24  --   GLUCOSE 187*  --   BUN 27*  --   CREATININE 1.41*  --   CALCIUM 9.6  --    GFR: Estimated Creatinine Clearance: 58.9 mL/min (A) (by C-G formula based on SCr of 1.41 mg/dL (H)). Liver Function Tests: Recent Labs  Lab 02/21/19 1816  AST 39  ALT 24  ALKPHOS 76  BILITOT 1.2  PROT 8.6*  ALBUMIN 3.2*   No results for input(s): LIPASE, AMYLASE in the last 168 hours. No results for input(s): AMMONIA in the last 168 hours. Coagulation Profile: No results for input(s): INR, PROTIME in the last 168 hours. Cardiac Enzymes: No results for input(s): CKTOTAL, CKMB, CKMBINDEX, TROPONINI in the last 168 hours. BNP (last 3 results) No results for input(s): PROBNP in the last 8760 hours. HbA1C: No results for input(s): HGBA1C in the last 72  hours. CBG: Recent Labs  Lab 02/22/19 0835 02/22/19 1155  GLUCAP 235* 163*   Lipid Profile: Recent Labs    02/21/19 1816  TRIG 203*   Thyroid Function Tests: No results for input(s): TSH, T4TOTAL, FREET4, T3FREE, THYROIDAB in the last 72 hours. Anemia Panel: Recent Labs    02/21/19 1816  FERRITIN 1,854*   Sepsis Labs: Recent Labs  Lab 02/21/19 1816 02/21/19 2348 02/22/19 0310  PROCALCITON <0.10  --   --   LATICACIDVEN 3.0* 1.5 1.9    Recent Results (from the past 240 hour(s))  Blood culture (routine x 2)     Status: None (Preliminary result)   Collection  Time: 02/21/19  6:10 PM   Specimen: BLOOD  Result Value Ref Range Status   Specimen Description   Final    BLOOD LEFT ANTECUBITAL Performed at [facility], [address] [place], [place] [address]    Special Requests   Final    BOTTLES DRAWN AEROBIC AND ANAEROBIC Blood Culture results may not be optimal due to an inadequate volume of blood received in culture bottles Performed at [facility], [place] [address], [place], [place] [address]    Culture NO GROWTH < 12 HOURS  Final   Report Status PENDING  Incomplete  Blood culture (routine x 2)     Status: None (Preliminary result)   Collection Time: 02/21/19  6:15 PM   Specimen: BLOOD  Result Value Ref Range Status   Specimen Description BLOOD LEFT ANTECUBITAL  Final   Special Requests   Final    BOTTLES DRAWN AEROBIC AND ANAEROBIC Blood Culture adequate volume Performed at [facility], [address] [place], [place] [address]    Culture NO GROWTH < 12 HOURS  Final   Report Status PENDING  Incomplete  SARS Coronavirus 2 Ag (30 min TAT) - Nasal Swab (BD Veritor Kit)     Status: None   Collection Time: 02/21/19  6:16 PM   Specimen: Nasal Swab (BD Veritor Kit)  Result Value Ref Range Status   SARS Coronavirus 2 Ag NEGATIVE NEGATIVE Final    Comment: (NOTE) SARS-CoV-2 antigen NOT DETECTED.  Negative results are presumptive.   Negative results do not preclude SARS-CoV-2 infection and should not be used as the sole basis for treatment or other patient management decisions, including infection  control decisions, particularly in the presence of clinical signs and  symptoms consistent with COVID-19, or in those who have been in contact with the virus.  Negative results must be combined with clinical observations, patient history, and epidemiological information. The expected result is Negative. Fact Sheet for Patients: PodPark.tn Fact Sheet for Healthcare Providers: GiftContent.is This test is not yet approved or cleared by the [place] FDA and  has been authorized for detection and/or diagnosis of SARS-CoV-2 by FDA under an Emergency Use Authorization (EUA).  This EUA will remain in effect (meaning this test can be used) for the duration of  the COVID-19 de claration under Section 564(b)(1) of the Act, 21 U.S.C. section 360bbb-3(b)(1), unless the authorization is terminated or revoked sooner. Performed at [facility], [address] [place], [place] [address]   SARS Coronavirus 2 by RT PCR (hospital order, performed in [facility] hospital lab) Nasopharyngeal Nasopharyngeal Swab     Status: None   Collection Time: 02/21/19  7:07 PM   Specimen: Nasopharyngeal Swab  Result Value Ref Range Status   SARS Coronavirus 2 NEGATIVE NEGATIVE Final    Comment: Performed at [facility] [address] [place], [place] [address]  Respiratory Panel by PCR     Status: None   Collection Time: 02/22/19  2:50 AM   Specimen: Nasopharyngeal Swab; Respiratory  Result Value Ref Range Status   Adenovirus NOT DETECTED NOT DETECTED Final   Coronavirus 229E NOT DETECTED NOT DETECTED Final    Comment: (NOTE) The Coronavirus on the Respiratory Panel, DOES NOT test for the novel  Coronavirus (2019 nCoV)    Coronavirus HKU1 NOT DETECTED NOT  DETECTED Final   Coronavirus NL63 NOT DETECTED NOT DETECTED Final   Coronavirus OC43 NOT DETECTED NOT  DETECTED Final   Metapneumovirus NOT DETECTED NOT DETECTED Final   Rhinovirus / Enterovirus NOT DETECTED NOT DETECTED Final   Influenza A NOT DETECTED NOT DETECTED Final   Influenza B NOT DETECTED NOT DETECTED Final   Parainfluenza Virus 1 NOT DETECTED NOT DETECTED Final   Parainfluenza Virus 2 NOT DETECTED NOT DETECTED Final   Parainfluenza Virus 3 NOT DETECTED NOT DETECTED Final   Parainfluenza Virus 4 NOT DETECTED NOT DETECTED Final   Respiratory Syncytial Virus NOT DETECTED NOT DETECTED Final   Bordetella pertussis NOT DETECTED NOT DETECTED Final   Chlamydophila pneumoniae NOT DETECTED NOT DETECTED Final   Mycoplasma pneumoniae NOT DETECTED NOT DETECTED Final    Comment: Performed at [facility], [place] [address] [place], [place] [address]  MRSA PCR Screening     Status: None   Collection Time: 02/22/19  2:50 AM   Specimen: Nasopharyngeal  Result Value Ref Range Status   MRSA by PCR NEGATIVE NEGATIVE Final    Comment:        The GeneXpert MRSA Assay (FDA approved for NASAL specimens only), is one component of a comprehensive MRSA colonization surveillance program. It is not intended to diagnose MRSA infection nor to guide or monitor treatment for MRSA infections. Performed at [facility], [facility] [address], [place], [place] [address]      Radiology Studies: CT Angio Chest PE W and/or Wo Contrast  Result Date: 02/21/2019 CLINICAL DATA:  Shortness of breath x3 days. EXAM: CT ANGIOGRAPHY CHEST WITH CONTRAST TECHNIQUE: Multidetector CT imaging of the chest was performed using the standard protocol during bolus administration of intravenous contrast. Multiplanar CT image reconstructions and MIPs were obtained to evaluate the vascular anatomy. CONTRAST:  183m OMNIPAQUE IOHEXOL 350 MG/ML SOLN COMPARISON:  None. FINDINGS: Cardiovascular: Satisfactory  opacification of the pulmonary arteries to the segmental level. No evidence of pulmonary embolism. Normal heart size. No pericardial effusion. Mediastinum/Nodes: No enlarged mediastinal, hilar, or axillary lymph nodes. Thyroid gland, trachea, and esophagus demonstrate no significant findings. Lungs/Pleura: Marked severity, predominately ground-glass appearing, infiltrates are seen throughout both lungs. There is no evidence of a pleural effusion or pneumothorax. Upper Abdomen: A 3.5 cm x 3.8 cm area of heterogeneous low attenuation is seen along the lateral aspect of the mid right kidney. Musculoskeletal: No chest wall abnormality. No acute or significant osseous findings. Review of the MIP images confirms the above findings. IMPRESSION: 1. Marked severity diffuse bilateral infiltrates. 2. Area of low attenuation within the right kidney. While this may represent a renal cyst, correlation with renal ultrasound is recommended to exclude the presence of a soft tissue component. Electronically Signed   By: TVirgina NorfolkM.D.   On: 02/21/2019 21:00   [place]NAL  Result Date: 02/22/2019 CLINICAL DATA:  Follow-up right renal lesions seen on recent chest CT. EXAM: RENAL / URINARY TRACT ULTRASOUND COMPLETE COMPARISON:  Chest CT 02/21/2019 FINDINGS: Right Kidney: Renal measurements: 12.4 x 5.8 x 4.9 cm = volume: 186.0 mL. Normal renal cortical thickness with slight increased echogenicity. Two right-sided renal cysts are noted. The upper pole lesion measures 4.0 x 3.3 x 3.5 cm and has a few thin septations. No worrisome sonographic features are identified. The second lesion is in the mid pole region and measures 3.2 x 3.2 x 3.1 cm. This has the appearance of a simple cyst. Third lesion measures 2.1 x 2.1 x 2.5 cm in the lower pole region. This has the appearance of a simple cyst. Left Kidney: Renal measurements:  13.2 x 6.0 x 6.1 cm = volume: 249.7 mL. Normal renal cortical thickness. Mild diffuse increased echogenicity.  Three cysts are noted. The largest cyst in the lower pole region measures 2.9 x 2.6 x 2.4 cm. There is also a midpole cyst measuring 2.1 x 2.1 x 2.5 cm. A smaller lower pole cyst measures 1.6 x 1.5 x 1.6 cm. Bladder: Appears normal for degree of bladder distention. Other: Mildly enlarged prostate gland is noted. IMPRESSION: 1. Diffuse increased echogenicity of the kidneys suggesting medical renal disease. 2. Bilateral renal cysts. No worrisome renal lesions or hydronephrosis. Electronically Signed   By: Marijo Sanes M.D.   On: 02/22/2019 08:12   DG Chest Port 1 View  Result Date: 02/21/2019 CLINICAL DATA:  Shortness of breath EXAM: PORTABLE CHEST 1 VIEW COMPARISON:  None. FINDINGS: The heart size and mediastinal contours are within normal limits. Mild hazy/ground-glass opacity seen within the bilateral lower lungs. No large airspace consolidation or pleural effusion. IMPRESSION: Mild hazy/ground-glass opacity at both lung bases which could be due to atelectasis and/or early infectious etiology. Electronically Signed   By: Prudencio Pair M.D.   On: 02/21/2019 19:33    Scheduled Meds: . aspirin  81 mg Oral Daily  . atenolol  25 mg Oral Daily  . atorvastatin  80 mg Oral Daily  . chlorhexidine  15 mL Mouth Rinse BID  . Chlorhexidine Gluconate Cloth  6 each Topical Daily  . dexamethasone (DECADRON) injection  6 mg Intravenous Q24H  . enoxaparin (LOVENOX) injection  40 mg Subcutaneous Q12H  . insulin aspart  0-9 Units Subcutaneous Q4H  . latanoprost  1 drop Both Eyes QHS  . mouth rinse  15 mL Mouth Rinse q12n4p  . tamsulosin  0.4 mg Oral Daily  . timolol  1 drop Both Eyes BID   Continuous Infusions: . azithromycin Stopped (02/22/19 0523)  . cefTRIAXone (ROCEPHIN)  IV Stopped (02/22/19 3893)  . remdesivir 200 mg in sodium chloride 0.9% 250 mL IVPB 200 mg (02/22/19 1223)   Followed by  . [START ON 02/23/2019] remdesivir 100 mg in NS 100 mL       LOS: 1 day   Marylu Lund, MD [facility] Pager On Amion  If 7PM-7AM, please contact night-coverage 02/22/2019, 12:33 PM

## 2019-02-22 NOTE — TOC Progression Note (Signed)
Transition of Care (TOC) - Progression Note    Patient Details  Name: Roxas Sigers. MRN: [id number] Date of Birth: Jun 10, 1945  Transition of Care [facility]) CM/SW Contact  Purcell Mouton, RN Phone Number: 02/22/2019, 12:21 PM  Clinical Narrative:     Spoke with pt's daughter [name] [phone] who revealed that pt's wife passed with COVID and funeral is tomorrow. Daughter asked about HCPOA, information was given to her concerning HCPOA.        Expected Discharge Plan and Services                                                 Social Determinants of Health (SDOH) Interventions    Readmission Risk Interventions No flowsheet data found.

## 2019-02-23 ENCOUNTER — Inpatient Hospital Stay (HOSPITAL_COMMUNITY): Payer: Medicare Other

## 2019-02-23 DIAGNOSIS — R7989 Other specified abnormal findings of blood chemistry: Secondary | ICD-10-CM

## 2019-02-23 DIAGNOSIS — E870 Hyperosmolality and hypernatremia: Secondary | ICD-10-CM

## 2019-02-23 LAB — CBC WITH DIFFERENTIAL/PLATELET
Abs Immature Granulocytes: 0.1 10*3/uL — ABNORMAL HIGH (ref 0.00–0.07)
Basophils Absolute: 0 10*3/uL (ref 0.0–0.1)
Basophils Relative: 0 %
Eosinophils Absolute: 0 10*3/uL (ref 0.0–0.5)
Eosinophils Relative: 0 %
HCT: 42.7 % (ref 39.0–52.0)
Hemoglobin: 13.2 g/dL (ref 13.0–17.0)
Immature Granulocytes: 1 %
Lymphocytes Relative: 7 %
Lymphs Abs: 0.8 10*3/uL (ref 0.7–4.0)
MCH: 26.2 pg (ref 26.0–34.0)
MCHC: 30.9 g/dL (ref 30.0–36.0)
MCV: 84.9 fL (ref 80.0–100.0)
Monocytes Absolute: 0.7 10*3/uL (ref 0.1–1.0)
Monocytes Relative: 6 %
Neutro Abs: 9.9 10*3/uL — ABNORMAL HIGH (ref 1.7–7.7)
Neutrophils Relative %: 86 %
Platelets: 389 10*3/uL (ref 150–400)
RBC: 5.03 MIL/uL (ref 4.22–5.81)
RDW: 14.5 % (ref 11.5–15.5)
WBC: 11.5 10*3/uL — ABNORMAL HIGH (ref 4.0–10.5)
nRBC: 0.2 % (ref 0.0–0.2)

## 2019-02-23 LAB — GLUCOSE, CAPILLARY
Glucose-Capillary: 162 mg/dL — ABNORMAL HIGH (ref 70–99)
Glucose-Capillary: 129 mg/dL — ABNORMAL HIGH (ref 70–99)
Glucose-Capillary: 167 mg/dL — ABNORMAL HIGH (ref 70–99)
Glucose-Capillary: 138 mg/dL — ABNORMAL HIGH (ref 70–99)
Glucose-Capillary: 176 mg/dL — ABNORMAL HIGH (ref 70–99)
Glucose-Capillary: 174 mg/dL — ABNORMAL HIGH (ref 70–99)
Glucose-Capillary: 173 mg/dL — ABNORMAL HIGH (ref 70–99)

## 2019-02-23 LAB — COMPREHENSIVE METABOLIC PANEL
ALT: 22 U/L (ref 0–44)
AST: 27 U/L (ref 15–41)
Albumin: 2.8 g/dL — ABNORMAL LOW (ref 3.5–5.0)
Alkaline Phosphatase: 62 U/L (ref 38–126)
Anion gap: 12 (ref 5–15)
BUN: 39 mg/dL — ABNORMAL HIGH (ref 8–23)
CO2: 23 mmol/L (ref 22–32)
Calcium: 9.1 mg/dL (ref 8.9–10.3)
Chloride: 113 mmol/L — ABNORMAL HIGH (ref 98–111)
Creatinine, Ser: 1.51 mg/dL — ABNORMAL HIGH (ref 0.61–1.24)
GFR calc Af Amer: 53 mL/min — ABNORMAL LOW (ref >60)
GFR calc non Af Amer: 46 mL/min — ABNORMAL LOW (ref >60)
Glucose, Bld: 168 mg/dL — ABNORMAL HIGH (ref 70–99)
Potassium: 4.3 mmol/L (ref 3.5–5.1)
Sodium: 148 mmol/L — ABNORMAL HIGH (ref 135–145)
Total Bilirubin: 1.1 mg/dL (ref 0.3–1.2)
Total Protein: 7.2 g/dL (ref 6.5–8.1)

## 2019-02-23 LAB — BASIC METABOLIC PANEL
Anion gap: 11 (ref 5–15)
BUN: 38 mg/dL — ABNORMAL HIGH (ref 8–23)
CO2: 26 mmol/L (ref 22–32)
Calcium: 9.2 mg/dL (ref 8.9–10.3)
Chloride: 109 mmol/L (ref 98–111)
Creatinine, Ser: 1.36 mg/dL — ABNORMAL HIGH (ref 0.61–1.24)
GFR calc Af Amer: >60 mL/min (ref >60)
GFR calc non Af Amer: 52 mL/min — ABNORMAL LOW (ref >60)
Glucose, Bld: 158 mg/dL — ABNORMAL HIGH (ref 70–99)
Potassium: 3.7 mmol/L (ref 3.5–5.1)
Sodium: 146 mmol/L — ABNORMAL HIGH (ref 135–145)

## 2019-02-23 LAB — D-DIMER, QUANTITATIVE: D-Dimer, Quant: 18.35 ug/mL-FEU — ABNORMAL HIGH (ref 0.00–0.50)

## 2019-02-23 LAB — FERRITIN: Ferritin: 1827 ng/mL — ABNORMAL HIGH (ref 24–336)

## 2019-02-23 LAB — C-REACTIVE PROTEIN: CRP: 4.9 mg/dL — ABNORMAL HIGH (ref <1.0)

## 2019-02-23 LAB — HIV ANTIBODY (ROUTINE TESTING W REFLEX): HIV Screen 4th Generation wRfx: NONREACTIVE

## 2019-02-23 LAB — LEGIONELLA PNEUMOPHILA SEROGP 1 UR AG: L. pneumophila Serogp 1 Ur Ag: NEGATIVE

## 2019-02-23 MED ORDER — DEXTROSE 5 % IV SOLN: INTRAVENOUS | Status: AC

## 2019-02-23 MED ORDER — INSULIN ASPART 100 UNIT/ML ~~LOC~~ SOLN
0.0000 [IU] | Freq: Three times a day (TID) | SUBCUTANEOUS | Status: DC
  Administered 2019-02-24: 1 [IU] via SUBCUTANEOUS
  Administered 2019-02-24 – 2019-02-25 (×3): 2 [IU] via SUBCUTANEOUS
  Administered 2019-02-26: 1 [IU] via SUBCUTANEOUS
  Administered 2019-02-26 – 2019-02-27 (×2): 2 [IU] via SUBCUTANEOUS
  Administered 2019-02-27 – 2019-02-28 (×2): 1 [IU] via SUBCUTANEOUS

## 2019-02-23 MED ORDER — AZITHROMYCIN 250 MG PO TABS
500.0000 mg | ORAL_TABLET | Freq: Every day | ORAL | Status: DC
  Administered 2019-02-23 – 2019-02-25 (×3): 500 mg via ORAL
  Filled 2019-02-23 (×3): qty 2

## 2019-02-23 NOTE — Progress Notes (Signed)
PROGRESS NOTE    Jerry Caldwell.    Code Status: Full Code  OJJ:[id number] DOB: 08-17-1945 DOA: 02/21/2019  PCP: Haywood Pao, MD    Hospital Summary  74 y.o.male,w hypertension, hyperlipidemia, Dm2, Bph, OSA presents with dyspnea x 3 days with hypoxia in ED. Despite symptoms and lab values consistent with COVID-19, patient has had negative respiratory panel as well as COVID-19 negative so far and has been on treatment for CAP as well as COVID-19 with remdesivir and steroids.. Significantly elevated D-dimer > 20.  Negative CTA chest for PE, negative bilateral lower extremity Doppler for DVT.   A & P   Principal Problem:   Acute respiratory failure with hypoxia (HCC) Active Problems:   Suspected COVID-19 virus infection   Pneumonia   Acute respiratory failure with hypoxia CAP vs likely covid-19  CAP  -Blood culture x2  no growth to date -Urine strep and legionella antigens negative -MRSA swab neg, thus vanocmycin discontinued -Continue on empiric Rocephin 2gm iv qday and Zithromax 528m iv qday for total 5 days  Presumed covid-19 infection -COVID antigen neg -COVID RNA negative -Positive COVID-19 contact in wife -CRP elevated at 9.8 with neg procal -Continue on empiric remdesivir and decadron -Ddimer of 6.57. CTA chest reviewed, neg for PE but does confirm marked diffuse B infiltrates -Continues on 15L high flow O2 -Consider ID consult if no improvement in a.m. -Incentive spirometry  Hypernatremia -2.8 L free water deficit -Treated with D5W -Follow-up in a.m.  ARF -Renal UKoreareviewed. Findings of medical renal disease -Cont hydration   Dm2 -glucose presently stable -Change sliding scale from every 4 hours to 3 times daily with meals  Hypertension -Cont Atenolol 215mpo qday as tolerated -Holding Hydrochlorothiazide 2551mo qday given concerns of dehydration -Holding Losartan 18m89m qday given ARF  Hyperlipidemia -Cont Lipitor 80mg59mqhs  as tolerated  Bph -Cont Flomax as tolerated -Voiding well this AM with condom cath  Glaucoma -Cont Timoptic and Xalatan as tolerated  DVT prophylaxis: Full dose Lovenox for now given significantly elevated D-dimer and concern for COVID-19 Family Communication: Patient's daughter has been updated by phone Disposition Plan: Continue inpatient work-up and management as above.  Barriers to discharge include medical stability.    Consultants  None  Procedures  None  Antibiotics   Anti-infectives (From admission, onward)   Start     Dose/Rate Route Frequency Ordered Stop   02/23/19 1000  remdesivir 100 mg in sodium chloride 0.9 % 100 mL IVPB     100 mg 200 mL/hr over 30 Minutes Intravenous Daily 02/22/19 0721 02/27/19 0959   02/23/19 1000  azithromycin (ZITHROMAX) tablet 500 mg     500 mg Oral Daily 02/23/19 0725     02/22/19 1000  remdesivir 200 mg in sodium chloride 0.9% 250 mL IVPB     200 mg 580 mL/hr over 30 Minutes Intravenous Once 02/22/19 0721 02/22/19 1900   02/22/19 0800  vancomycin (VANCOREADY) IVPB 1750 mg/350 mL  Status:  Discontinued     1,750 mg 175 mL/hr over 120 Minutes Intravenous Every 24 hours 02/22/19 0311 02/22/19 0733   02/22/19 0400  cefTRIAXone (ROCEPHIN) 2 g in sodium chloride 0.9 % 100 mL IVPB     2 g 200 mL/hr over 30 Minutes Intravenous Daily 02/22/19 0256     02/22/19 0300  azithromycin (ZITHROMAX) 500 mg in sodium chloride 0.9 % 250 mL IVPB  Status:  Discontinued     500 mg 250 mL/hr over 60 Minutes  Intravenous Daily 02/22/19 0256 02/23/19 0725   02/21/19 2130  vancomycin (VANCOCIN) IVPB 1000 mg/200 mL premix     1,000 mg 200 mL/hr over 60 Minutes Intravenous  Once 02/21/19 2118 02/22/19 0007   02/21/19 2130  piperacillin-tazobactam (ZOSYN) IVPB 3.375 g     3.375 g 12.5 mL/hr over 240 Minutes Intravenous  Once 02/21/19 2118 02/21/19 2300           Subjective   Patient seen and examined at bedside in no acute distress and resting  comfortably. No acute events overnight. Denies any acute complaints at this time. Ambulating. Tolerating diet well.  Nurse at bedside states patient continues to require 15 L nasal cannula and occasionally has intermittent desaturations to mid 80s.  Objective   Vitals:   02/23/19 1700 02/23/19 1800 02/23/19 1900 02/23/19 2000  BP: 109/75 122/75 127/81   Pulse: 67 68 66   Resp: (!) 22 13 (!) 28   Temp:    97.9 F (36.6 C)  TempSrc:      SpO2: 92% 90% 95%   Weight:      Height:        Intake/Output Summary (Last 24 hours) at 02/23/2019 2012 Last data filed at 02/23/2019 1900 Gross per 24 hour  Intake 1989.02 ml  Output 1800 ml  Net 189.02 ml   Filed Weights   02/21/19 1758 02/22/19 0225  Weight: 95.3 kg 97 kg    Examination:  Physical Exam Vitals and nursing note reviewed. Exam conducted with a chaperone present.  Constitutional:      Appearance: Normal appearance. He is not ill-appearing.  HENT:     Head: Normocephalic and atraumatic.     Nose: Nose normal.     Mouth/Throat:     Mouth: Mucous membranes are moist.  Eyes:     Extraocular Movements: Extraocular movements intact.  Cardiovascular:     Rate and Rhythm: Normal rate and regular rhythm.  Pulmonary:     Effort: Pulmonary effort is normal. No respiratory distress.     Comments: On 15 L/min via high flow nasal cannula with desaturation to mid to high 80s, resolved with positional change  Abdominal:     General: Abdomen is flat.     Palpations: Abdomen is soft.  Musculoskeletal:        General: No swelling. Normal range of motion.  Neurological:     General: No focal deficit present.     Mental Status: He is alert. Mental status is at baseline.  Psychiatric:        Mood and Affect: Mood normal.        Behavior: Behavior normal.     Data Reviewed: I have personally reviewed following labs and imaging studies  CBC: Recent Labs  Lab 02/21/19 1816 02/21/19 2226 02/22/19 1314 02/23/19 0307  WBC 7.7  --   8.5 11.5*  NEUTROABS 6.1  --   --  9.9*  HGB 14.9 12.9* 13.1 13.2  HCT 46.2 38.0* 41.1 42.7  MCV 81.9  --  83.5 84.9  PLT 362  --  362 034   Basic Metabolic Panel: Recent Labs  Lab 02/21/19 1816 02/21/19 2226 02/22/19 1314 02/23/19 0307 02/23/19 1605  NA 148* 145 150* 148* 146*  K 3.7 3.9 4.3 4.3 3.7  CL 112*  --  113* 113* 109  CO2 24  --  '25 23 26  ' GLUCOSE 187*  --  162* 168* 158*  BUN 27*  --  33* 39* 38*  CREATININE  1.41*  --  1.49* 1.51* 1.36*  CALCIUM 9.6  --  9.2 9.1 9.2   GFR: Estimated Creatinine Clearance: 61.1 mL/min (A) (by C-G formula based on SCr of 1.36 mg/dL (H)). Liver Function Tests: Recent Labs  Lab 02/21/19 1816 02/22/19 1314 02/23/19 0307  AST 39 26 27  ALT '24 21 22  ' ALKPHOS 76 68 62  BILITOT 1.2 1.0 1.1  PROT 8.6* 7.5 7.2  ALBUMIN 3.2* 2.9* 2.8*   No results for input(s): LIPASE, AMYLASE in the last 168 hours. No results for input(s): AMMONIA in the last 168 hours. Coagulation Profile: No results for input(s): INR, PROTIME in the last 168 hours. Cardiac Enzymes: No results for input(s): CKTOTAL, CKMB, CKMBINDEX, TROPONINI in the last 168 hours. BNP (last 3 results) No results for input(s): PROBNP in the last 8760 hours. HbA1C: No results for input(s): HGBA1C in the last 72 hours. CBG: Recent Labs  Lab 02/22/19 2254 02/23/19 0439 02/23/19 0738 02/23/19 1140 02/23/19 1519  GLUCAP 129* 167* 138* 176* 174*   Lipid Profile: Recent Labs    02/21/19 1816  TRIG 203*   Thyroid Function Tests: No results for input(s): TSH, T4TOTAL, FREET4, T3FREE, THYROIDAB in the last 72 hours. Anemia Panel: Recent Labs    02/22/19 1314 02/23/19 0307  FERRITIN 1,554* 1,827*   Sepsis Labs: Recent Labs  Lab 02/21/19 1816 02/21/19 2348 02/22/19 0310  PROCALCITON <0.10  --   --   LATICACIDVEN 3.0* 1.5 1.9    Recent Results (from the past 240 hour(s))  Blood culture (routine x 2)     Status: None (Preliminary result)   Collection Time:  02/21/19  6:10 PM   Specimen: BLOOD  Result Value Ref Range Status   Specimen Description   Final    BLOOD LEFT ANTECUBITAL Performed at [facility], [address] [place], [place] [address]    Special Requests   Final    BOTTLES DRAWN AEROBIC AND ANAEROBIC Blood Culture results may not be optimal due to an inadequate volume of blood received in culture bottles   Culture   Final    NO GROWTH 2 DAYS Performed at [facility] [address], [place], [place] [address]    Report Status PENDING  Incomplete  Blood culture (routine x 2)     Status: None (Preliminary result)   Collection Time: 02/21/19  6:15 PM   Specimen: BLOOD  Result Value Ref Range Status   Specimen Description   Final    BLOOD LEFT ANTECUBITAL Performed at [facility], [address] [place], [place] [address]    Special Requests   Final    BOTTLES DRAWN AEROBIC AND ANAEROBIC Blood Culture adequate volume Performed at [facility], [address] [place], [place] [address]    Culture   Final    NO GROWTH 2 DAYS Performed at [facility], [place] [address] [place], [place] [address]    Report Status PENDING  Incomplete  SARS Coronavirus 2 Ag (30 min TAT) - Nasal Swab (BD Veritor Kit)     Status: None   Collection Time: 02/21/19  6:16 PM   Specimen: Nasal Swab (BD Veritor Kit)  Result Value Ref Range Status   SARS Coronavirus 2 Ag NEGATIVE NEGATIVE Final    Comment: (NOTE) SARS-CoV-2 antigen NOT DETECTED.  Negative results are presumptive.  Negative results do not preclude SARS-CoV-2 infection and should not be used as the sole basis  for treatment or other patient management decisions, including infection  control decisions, particularly in the presence of clinical signs and  symptoms consistent with COVID-19, or in those who have been in contact with the virus.  Negative results must be combined with clinical observations, patient history, and  epidemiological information. The expected result is Negative. Fact Sheet for Patients: PodPark.tn Fact Sheet for Healthcare Providers: GiftContent.is This test is not yet approved or cleared by the [place] FDA and  has been authorized for detection and/or diagnosis of SARS-CoV-2 by FDA under an Emergency Use Authorization (EUA).  This EUA will remain in effect (meaning this test can be used) for the duration of  the COVID-19 de claration under Section 564(b)(1) of the Act, 21 U.S.C. section 360bbb-3(b)(1), unless the authorization is terminated or revoked sooner. Performed at [facility], [address] [place], [place] [address]   SARS Coronavirus 2 by RT PCR (hospital order, performed in [facility] hospital lab) Nasopharyngeal Nasopharyngeal Swab     Status: None   Collection Time: 02/21/19  7:07 PM   Specimen: Nasopharyngeal Swab  Result Value Ref Range Status   SARS Coronavirus 2 NEGATIVE NEGATIVE Final    Comment: Performed at [facility], [address] [place], [place] [address]  SARS CORONAVIRUS 2 (TAT 6-24 HRS) Nasopharyngeal Nasopharyngeal Swab     Status: None   Collection Time: 02/22/19  2:50 AM   Specimen: Nasopharyngeal Swab  Result Value Ref Range Status   SARS Coronavirus 2 NEGATIVE NEGATIVE Final    Comment: (NOTE) SARS-CoV-2 target nucleic acids are NOT DETECTED. The SARS-CoV-2 RNA is generally detectable in upper and lower respiratory specimens during the acute phase of infection. Negative results do not preclude SARS-CoV-2 infection, do not rule out co-infections with other pathogens, and should not be used as the sole basis for treatment or other patient management decisions. Negative results must be combined with clinical observations, patient history, and epidemiological information. The expected result is Negative. Fact Sheet for  Patients: SugarRoll.be Fact Sheet for Healthcare Providers: https://www.woods-mathews.com/ This test is not yet approved or cleared by the [place] FDA and  has been authorized for detection and/or diagnosis of SARS-CoV-2 by FDA under an Emergency Use Authorization (EUA). This EUA will remain  in effect (meaning this test can be used) for the duration of the COVID-19 declaration under Section 56 4(b)(1) of the Act, 21 U.S.C. section 360bbb-3(b)(1), unless the authorization is terminated or revoked sooner. Performed at [facility], [place] [address] [place], [place] [address]   Respiratory Panel by PCR     Status: None   Collection Time: 02/22/19  2:50 AM   Specimen: Nasopharyngeal Swab; Respiratory  Result Value Ref Range Status   Adenovirus NOT DETECTED NOT DETECTED Final   Coronavirus 229E NOT DETECTED NOT DETECTED Final    Comment: (NOTE) The Coronavirus on the Respiratory Panel, DOES NOT test for the novel  Coronavirus (2019 nCoV)    Coronavirus HKU1 NOT DETECTED NOT DETECTED Final   Coronavirus NL63 NOT DETECTED NOT DETECTED Final   Coronavirus OC43 NOT DETECTED NOT DETECTED Final   Metapneumovirus NOT DETECTED NOT DETECTED Final   Rhinovirus / Enterovirus NOT DETECTED NOT DETECTED Final   Influenza A NOT DETECTED NOT DETECTED Final   Influenza B NOT DETECTED NOT DETECTED Final   Parainfluenza Virus 1 NOT DETECTED NOT DETECTED Final   Parainfluenza Virus 2 NOT DETECTED NOT DETECTED Final   Parainfluenza Virus 3  NOT DETECTED NOT DETECTED Final   Parainfluenza Virus 4 NOT DETECTED NOT DETECTED Final   Respiratory Syncytial Virus NOT DETECTED NOT DETECTED Final   Bordetella pertussis NOT DETECTED NOT DETECTED Final   Chlamydophila pneumoniae NOT DETECTED NOT DETECTED Final   Mycoplasma pneumoniae NOT DETECTED NOT DETECTED Final    Comment: Performed at [facility], [place] [address], [place], [place] [address]  MRSA PCR  Screening     Status: None   Collection Time: 02/22/19  2:50 AM   Specimen: Nasopharyngeal  Result Value Ref Range Status   MRSA by PCR NEGATIVE NEGATIVE Final    Comment:        The GeneXpert MRSA Assay (FDA approved for NASAL specimens only), is one component of a comprehensive MRSA colonization surveillance program. It is not intended to diagnose MRSA infection nor to guide or monitor treatment for MRSA infections. Performed at [facility], [place] [address] [place], [place] [address]          Radiology Studies: CT Angio Chest PE W and/or Wo Contrast  Result Date: 02/21/2019 CLINICAL DATA:  Shortness of breath x3 days. EXAM: CT ANGIOGRAPHY CHEST WITH CONTRAST TECHNIQUE: Multidetector CT imaging of the chest was performed using the standard protocol during bolus administration of intravenous contrast. Multiplanar CT image reconstructions and MIPs were obtained to evaluate the vascular anatomy. CONTRAST:  130m OMNIPAQUE IOHEXOL 350 MG/ML SOLN COMPARISON:  None. FINDINGS: Cardiovascular: Satisfactory opacification of the pulmonary arteries to the segmental level. No evidence of pulmonary embolism. Normal heart size. No pericardial effusion. Mediastinum/Nodes: No enlarged mediastinal, hilar, or axillary lymph nodes. Thyroid gland, trachea, and esophagus demonstrate no significant findings. Lungs/Pleura: Marked severity, predominately ground-glass appearing, infiltrates are seen throughout both lungs. There is no evidence of a pleural effusion or pneumothorax. Upper Abdomen: A 3.5 cm x 3.8 cm area of heterogeneous low attenuation is seen along the lateral aspect of the mid right kidney. Musculoskeletal: No chest wall abnormality. No acute or significant osseous findings. Review of the MIP images confirms the above findings. IMPRESSION: 1. Marked severity diffuse bilateral infiltrates. 2. Area of low attenuation within the right kidney. While this may represent a renal cyst,  correlation with renal ultrasound is recommended to exclude the presence of a soft tissue component. Electronically Signed   By: TVirgina NorfolkM.D.   On: 02/21/2019 21:00   [place]  Result Date: 02/22/2019 CLINICAL DATA:  Follow-up right renal lesions seen on recent chest CT. EXAM: RENAL / URINARY TRACT ULTRASOUND COMPLETE COMPARISON:  Chest CT 02/21/2019 FINDINGS: Right Kidney: Renal measurements: 12.4 x 5.8 x 4.9 cm = volume: 186.0 mL. Normal renal cortical thickness with slight increased echogenicity. Two right-sided renal cysts are noted. The upper pole lesion measures 4.0 x 3.3 x 3.5 cm and has a few thin septations. No worrisome sonographic features are identified. The second lesion is in the mid pole region and measures 3.2 x 3.2 x 3.1 cm. This has the appearance of a simple cyst. Third lesion measures 2.1 x 2.1 x 2.5 cm in the lower pole region. This has the appearance of a simple cyst. Left Kidney: Renal measurements: 13.2 x 6.0 x 6.1 cm = volume: 249.7 mL. Normal renal cortical thickness. Mild diffuse increased echogenicity. Three cysts are noted. The largest cyst in the lower pole region measures 2.9 x 2.6 x 2.4 cm. There is also a midpole cyst measuring 2.1 x 2.1 x 2.5 cm. A smaller lower pole cyst measures 1.6 x  1.5 x 1.6 cm. Bladder: Appears normal for degree of bladder distention. Other: Mildly enlarged prostate gland is noted. IMPRESSION: 1. Diffuse increased echogenicity of the kidneys suggesting medical renal disease. 2. Bilateral renal cysts. No worrisome renal lesions or hydronephrosis. Electronically Signed   By: Marijo Sanes M.D.   On: 02/22/2019 08:12   VAS [place] LOWER EXTREMITY VENOUS (DVT)  Result Date: 02/23/2019  Lower Venous DVTStudy Indications: Edema, and Pain.  Comparison Study: Performing Technologist: Baldwin Crown RDMS, RVT  Examination Guidelines: A complete evaluation includes B-mode imaging, spectral Doppler, color Doppler, and power Doppler as needed of all  accessible portions of each vessel. Bilateral testing is considered an integral part of a complete examination. Limited examinations for reoccurring indications may be performed as noted. The reflux portion of the exam is performed with the patient in reverse Trendelenburg.  +---------+---------------+---------+-----------+----------+--------------+ RIGHT    CompressibilityPhasicitySpontaneityPropertiesThrombus Aging +---------+---------------+---------+-----------+----------+--------------+ CFV      Full           Yes      Yes                                 +---------+---------------+---------+-----------+----------+--------------+ SFJ      Full                                                        +---------+---------------+---------+-----------+----------+--------------+ FV Prox  Full                                                        +---------+---------------+---------+-----------+----------+--------------+ FV Mid   Full                                                        +---------+---------------+---------+-----------+----------+--------------+ FV DistalFull                                                        +---------+---------------+---------+-----------+----------+--------------+ PFV      Full                                                        +---------+---------------+---------+-----------+----------+--------------+ POP      Full           Yes      Yes                                 +---------+---------------+---------+-----------+----------+--------------+ PTV      Full                                                        +---------+---------------+---------+-----------+----------+--------------+  PERO     Full                                                        +---------+---------------+---------+-----------+----------+--------------+   +---------+---------------+---------+-----------+----------+--------------+  LEFT     CompressibilityPhasicitySpontaneityPropertiesThrombus Aging +---------+---------------+---------+-----------+----------+--------------+ CFV      Full           Yes      Yes                                 +---------+---------------+---------+-----------+----------+--------------+ SFJ      Full                                                        +---------+---------------+---------+-----------+----------+--------------+ FV Prox  Full                                                        +---------+---------------+---------+-----------+----------+--------------+ FV Mid   Full                                                        +---------+---------------+---------+-----------+----------+--------------+ FV DistalFull                                                        +---------+---------------+---------+-----------+----------+--------------+ PFV      Full                                                        +---------+---------------+---------+-----------+----------+--------------+ POP      Full           Yes      Yes                                 +---------+---------------+---------+-----------+----------+--------------+ PTV      Full                                                        +---------+---------------+---------+-----------+----------+--------------+ PERO     Full                                                        +---------+---------------+---------+-----------+----------+--------------+  Slow blood flow and rouleaux effect seen throughout, specifically left popliteal vein.    Summary: RIGHT: - There is no evidence of deep vein thrombosis in the lower extremity.  - No cystic structure found in the popliteal fossa.  LEFT: - There is no evidence of deep vein thrombosis in the lower extremity.  - No cystic structure found in the popliteal fossa.  *See table(s) above for measurements and observations.  Electronically signed by Curt Jews MD on 02/23/2019 at 5:12:45 PM.    Final         Scheduled Meds: . aspirin  81 mg Oral Daily  . atenolol  25 mg Oral Daily  . atorvastatin  80 mg Oral Daily  . azithromycin  500 mg Oral Daily  . chlorhexidine  15 mL Mouth Rinse BID  . Chlorhexidine Gluconate Cloth  6 each Topical Daily  . dexamethasone (DECADRON) injection  6 mg Intravenous Q24H  . enoxaparin (LOVENOX) injection  100 mg Subcutaneous Q12H  . insulin aspart  0-9 Units Subcutaneous Q4H  . latanoprost  1 drop Both Eyes QHS  . mouth rinse  15 mL Mouth Rinse q12n4p  . tamsulosin  0.4 mg Oral Daily  . timolol  1 drop Both Eyes BID   Continuous Infusions: . cefTRIAXone (ROCEPHIN)  IV Stopped (02/23/19 0603)  . remdesivir 100 mg in NS 100 mL Stopped (02/23/19 0936)     LOS: 2 days    Time spent: 30 minutes with over 50% of the time coordinating the patient's care    Harold Hedge, DO [facility] Pager [phone]  If 7PM-7AM, please contact night-coverage [email or web address] Password [email or web address] 02/23/2019, 8:12 PM

## 2019-02-23 NOTE — Progress Notes (Signed)
Bilateral lower extremity venous duplex exam performed.  Preliminary results can be found under CV proc under chart review.  02/23/2019 10:00 AM  Alishba Naples, K., RDMS, RVT

## 2019-02-24 LAB — COMPREHENSIVE METABOLIC PANEL
ALT: 30 U/L (ref 0–44)
AST: 36 U/L (ref 15–41)
Albumin: 2.6 g/dL — ABNORMAL LOW (ref 3.5–5.0)
Alkaline Phosphatase: 63 U/L (ref 38–126)
Anion gap: 12 (ref 5–15)
BUN: 32 mg/dL — ABNORMAL HIGH (ref 8–23)
CO2: 24 mmol/L (ref 22–32)
Calcium: 9.1 mg/dL (ref 8.9–10.3)
Chloride: 107 mmol/L (ref 98–111)
Creatinine, Ser: 1.14 mg/dL (ref 0.61–1.24)
GFR calc Af Amer: >60 mL/min (ref >60)
GFR calc non Af Amer: >60 mL/min (ref >60)
Glucose, Bld: 147 mg/dL — ABNORMAL HIGH (ref 70–99)
Potassium: 3.9 mmol/L (ref 3.5–5.1)
Sodium: 143 mmol/L (ref 135–145)
Total Bilirubin: 1 mg/dL (ref 0.3–1.2)
Total Protein: 6.9 g/dL (ref 6.5–8.1)

## 2019-02-24 LAB — CBC WITH DIFFERENTIAL/PLATELET
Abs Immature Granulocytes: 0.13 10*3/uL — ABNORMAL HIGH (ref 0.00–0.07)
Basophils Absolute: 0 10*3/uL (ref 0.0–0.1)
Basophils Relative: 0 %
Eosinophils Absolute: 0 10*3/uL (ref 0.0–0.5)
Eosinophils Relative: 0 %
HCT: 43.2 % (ref 39.0–52.0)
Hemoglobin: 13.7 g/dL (ref 13.0–17.0)
Immature Granulocytes: 1 %
Lymphocytes Relative: 7 %
Lymphs Abs: 0.8 10*3/uL (ref 0.7–4.0)
MCH: 26.5 pg (ref 26.0–34.0)
MCHC: 31.7 g/dL (ref 30.0–36.0)
MCV: 83.6 fL (ref 80.0–100.0)
Monocytes Absolute: 0.8 10*3/uL (ref 0.1–1.0)
Monocytes Relative: 7 %
Neutro Abs: 9.3 10*3/uL — ABNORMAL HIGH (ref 1.7–7.7)
Neutrophils Relative %: 85 %
Platelets: 395 10*3/uL (ref 150–400)
RBC: 5.17 MIL/uL (ref 4.22–5.81)
RDW: 14.3 % (ref 11.5–15.5)
WBC: 11 10*3/uL — ABNORMAL HIGH (ref 4.0–10.5)
nRBC: 0.2 % (ref 0.0–0.2)

## 2019-02-24 LAB — GLUCOSE, CAPILLARY
Glucose-Capillary: 179 mg/dL — ABNORMAL HIGH (ref 70–99)
Glucose-Capillary: 137 mg/dL — ABNORMAL HIGH (ref 70–99)
Glucose-Capillary: 120 mg/dL — ABNORMAL HIGH (ref 70–99)
Glucose-Capillary: 141 mg/dL — ABNORMAL HIGH (ref 70–99)

## 2019-02-24 LAB — MAGNESIUM: Magnesium: 2.5 mg/dL — ABNORMAL HIGH (ref 1.7–2.4)

## 2019-02-24 LAB — FERRITIN: Ferritin: 1668 ng/mL — ABNORMAL HIGH (ref 24–336)

## 2019-02-24 LAB — D-DIMER, QUANTITATIVE: D-Dimer, Quant: 5.86 ug/mL-FEU — ABNORMAL HIGH (ref 0.00–0.50)

## 2019-02-24 LAB — C-REACTIVE PROTEIN: CRP: 2.3 mg/dL — ABNORMAL HIGH (ref <1.0)

## 2019-02-24 MED ORDER — LINAGLIPTIN 5 MG PO TABS
5.0000 mg | ORAL_TABLET | Freq: Every day | ORAL | Status: DC
  Administered 2019-02-24 – 2019-02-28 (×5): 5 mg via ORAL
  Filled 2019-02-24 (×6): qty 1

## 2019-02-24 MED ORDER — ENOXAPARIN SODIUM 40 MG/0.4ML ~~LOC~~ SOLN
40.0000 mg | Freq: Two times a day (BID) | SUBCUTANEOUS | Status: DC
  Administered 2019-02-24 – 2019-02-26 (×5): 40 mg via SUBCUTANEOUS
  Filled 2019-02-24 (×4): qty 0.4

## 2019-02-24 NOTE — Progress Notes (Signed)
Call received from both patients daughters Elmyra Ricks and Acme. Both updated on continued plan of care.

## 2019-02-24 NOTE — Progress Notes (Signed)
PROGRESS NOTE    Jerry Caldwell.    Code Status: Full Code  VHQ:[id number] DOB: [birth year] DOA: 02/21/2019  PCP: Haywood Pao, MD    Hospital Summary  73 y.o.male,w hypertension, hyperlipidemia, Dm2, Bph, OSA presents with dyspnea x 3 days with hypoxia in ED. Despite symptoms and lab values consistent with COVID-19, patient has had negative respiratory panel as well as COVID-19 negative so far and has been on treatment for CAP as well as COVID-19 with remdesivir and steroids. Significantly elevated initial D-dimer > 20, started on full dose lovenox.  Negative CTA chest for PE, negative bilateral lower extremity Doppler for DVT. D Dimer decreased and lovenox changed to intermediate dosing   A & P   Principal Problem:   Acute respiratory failure with hypoxia (HCC) Active Problems:   Suspected COVID-19 virus infection   Pneumonia   Acute respiratory failure with hypoxia CAP vs likely covid-19   CAP  -Blood culture x2  no growth to date -Urine strep and legionella antigens negative -MRSA swab neg, thus vanocmycin discontinued -Continue on empiric Rocephin 2gm iv qday and Zithromax 579m iv qday for total 5 days  Presumed covid-19 infection -COVID antigen neg, COVID RNA negative -Positive COVID-19 contact in wife -CRP elevated at 9.8->2.3, elevated ferritin, neg procal -Discussed with ID, okay to continue on empiric remdesivir and decadron -Ddimer >20->5.86. CTA chest reviewed, neg for PE but does confirm marked diffuse B infiltrates -Continues on 15L high flow O2 -Incentive spirometry -Change Lovenox to intermediate dosing per pharmacy  Hypernatremia - resolved with D5W  ARF -Likely from dehydration, resolved with D5W -Renal UKoreareviewed. Findings of medical renal disease  Dm2 -glucose presently stable -Continue sliding scale 3 times daily with meals -Add Tradjenta  Hypertension -Cont Atenolol 230mpo qday as tolerated -Holding Hydrochlorothiazide  2553mo qday given concerns of dehydration -Holding Losartan 66m66m qday given ARF  Hyperlipidemia -Cont Lipitor 80mg24mqhs as tolerated  Bph -Cont Flomax as tolerated  Glaucoma -Cont Timoptic and Xalatan as tolerated  DVT prophylaxis: Decreased to intermediate Lovenox per pharmacy Family Communication: Patient's daughter has been updated by phone today Disposition Plan: Continue inpatient work-up and management as above.  Barriers to discharge include medical stability.    Consultants  None  Procedures  None  Antibiotics   Anti-infectives (From admission, onward)   Start     Dose/Rate Route Frequency Ordered Stop   02/23/19 1000  remdesivir 100 mg in sodium chloride 0.9 % 100 mL IVPB     100 mg 200 mL/hr over 30 Minutes Intravenous Daily 02/22/19 0721 02/27/19 0959   02/23/19 1000  azithromycin (ZITHROMAX) tablet 500 mg     500 mg Oral Daily 02/23/19 0725     02/22/19 1000  remdesivir 200 mg in sodium chloride 0.9% 250 mL IVPB     200 mg 580 mL/hr over 30 Minutes Intravenous Once 02/22/19 0721 02/22/19 1900   02/22/19 0800  vancomycin (VANCOREADY) IVPB 1750 mg/350 mL  Status:  Discontinued     1,750 mg 175 mL/hr over 120 Minutes Intravenous Every 24 hours 02/22/19 0311 02/22/19 0733   02/22/19 0400  cefTRIAXone (ROCEPHIN) 2 g in sodium chloride 0.9 % 100 mL IVPB     2 g 200 mL/hr over 30 Minutes Intravenous Daily 02/22/19 0256     02/22/19 0300  azithromycin (ZITHROMAX) 500 mg in sodium chloride 0.9 % 250 mL IVPB  Status:  Discontinued     500 mg 250 mL/hr over 60 Minutes  Intravenous Daily 02/22/19 0256 02/23/19 0725   02/21/19 2130  vancomycin (VANCOCIN) IVPB 1000 mg/200 mL premix     1,000 mg 200 mL/hr over 60 Minutes Intravenous  Once 02/21/19 2118 02/22/19 0007   02/21/19 2130  piperacillin-tazobactam (ZOSYN) IVPB 3.375 g     3.375 g 12.5 mL/hr over 240 Minutes Intravenous  Once 02/21/19 2118 02/21/19 2300           Subjective   Patient resting  comfortably at bedside, continues to require [place].  Denies any shortness of breath, palpitations or chest pain.  Denies any complaints at this time.  Objective   Vitals:   02/24/19 0500 02/24/19 0505 02/24/19 0600 02/24/19 0800  BP:  123/84 (!) 142/86   Pulse:  68 69   Resp:  17 (!) 23   Temp:    97.9 F (36.6 C)  TempSrc:    Oral  SpO2:  90% 96%   Weight: 79.5 kg     Height:        Intake/Output Summary (Last 24 hours) at 02/24/2019 1346 Last data filed at 02/24/2019 0500 Gross per 24 hour  Intake 450 ml  Output 950 ml  Net -500 ml   Filed Weights   02/21/19 1758 02/22/19 0225 02/24/19 0500  Weight: 95.3 kg 97 kg 79.5 kg    Examination:  Physical Exam Vitals and nursing note reviewed.  Constitutional:      General: He is not in acute distress. HENT:     Head: Normocephalic.  Eyes:     Conjunctiva/sclera: Conjunctivae normal.  Cardiovascular:     Rate and Rhythm: Normal rate and regular rhythm.  Pulmonary:     Effort: Pulmonary effort is normal. No respiratory distress.     Breath sounds: No wheezing.  Abdominal:     General: Abdomen is flat. There is no distension.  Musculoskeletal:        General: No swelling or tenderness.  Skin:    General: Skin is warm.  Neurological:     Mental Status: He is alert. Mental status is at baseline.  Psychiatric:        Mood and Affect: Mood normal.        Behavior: Behavior normal.     Data Reviewed: I have personally reviewed following labs and imaging studies  CBC: Recent Labs  Lab 02/21/19 1816 02/21/19 2226 02/22/19 1314 02/23/19 0307 02/24/19 0229  WBC 7.7  --  8.5 11.5* 11.0*  NEUTROABS 6.1  --   --  9.9* 9.3*  HGB 14.9 12.9* 13.1 13.2 13.7  HCT 46.2 38.0* 41.1 42.7 43.2  MCV 81.9  --  83.5 84.9 83.6  PLT 362  --  362 389 161   Basic Metabolic Panel: Recent Labs  Lab 02/21/19 1816 02/21/19 1816 02/21/19 2226 02/22/19 1314 02/23/19 0307 02/23/19 1605 02/24/19 0229  NA 148*   < > 145 150* 148* 146*  143  K 3.7   < > 3.9 4.3 4.3 3.7 3.9  CL 112*  --   --  113* 113* 109 107  CO2 24  --   --  _0 GLUCOSE 187*  --   --  162* 168* 158* 147*  BUN 27*  --   --  33* 39* 38* 32*  CREATININE 1.41*  --   --  1.49* 1.51* 1.36* 1.14  CALCIUM 9.6  --   --  9.2 9.1 9.2 9.1  MG  --   --   --   --   --   --  2.5*   < > = values in this interval not displayed.   GFR: Estimated Creatinine Clearance: 66.8 mL/min (by C-G formula based on SCr of 1.14 mg/dL). Liver Function Tests: Recent Labs  Lab 02/21/19 1816 02/22/19 1314 02/23/19 0307 02/24/19 0229  AST 39 26 27 36  ALT _0 ALKPHOS 76 68 62 63  BILITOT 1.2 1.0 1.1 1.0  PROT 8.6* 7.5 7.2 6.9  ALBUMIN 3.2* 2.9* 2.8* 2.6*   No results for input(s): LIPASE, AMYLASE in the last 168 hours. No results for input(s): AMMONIA in the last 168 hours. Coagulation Profile: No results for input(s): INR, PROTIME in the last 168 hours. Cardiac Enzymes: No results for input(s): CKTOTAL, CKMB, CKMBINDEX, TROPONINI in the last 168 hours. BNP (last 3 results) No results for input(s): PROBNP in the last 8760 hours. HbA1C: No results for input(s): HGBA1C in the last 72 hours. CBG: Recent Labs  Lab 02/23/19 0439 02/23/19 0738 02/23/19 1140 02/23/19 1519 02/23/19 2007  GLUCAP 167* 138* 176* 174* 173*   Lipid Profile: Recent Labs    02/21/19 1816  TRIG 203*   Thyroid Function Tests: No results for input(s): TSH, T4TOTAL, FREET4, T3FREE, THYROIDAB in the last 72 hours. Anemia Panel: Recent Labs    02/23/19 0307 02/24/19 0229  FERRITIN 1,827* 1,668*   Sepsis Labs: Recent Labs  Lab 02/21/19 1816 02/21/19 2348 02/22/19 0310  PROCALCITON <0.10  --   --   LATICACIDVEN 3.0* 1.5 1.9    Recent Results (from the past 240 hour(s))  Blood culture (routine x 2)     Status: None (Preliminary result)   Collection Time: 02/21/19  6:10 PM   Specimen: BLOOD  Result Value Ref Range Status   Specimen Description   Final    BLOOD  LEFT ANTECUBITAL Performed at [facility], [address] [place], [place] [address]    Special Requests   Final    BOTTLES DRAWN AEROBIC AND ANAEROBIC Blood Culture results may not be optimal due to an inadequate volume of blood received in culture bottles   Culture   Final    NO GROWTH 3 DAYS Performed at [facility], [place] [address], [place], [place] [address]    Report Status PENDING  Incomplete  Blood culture (routine x 2)     Status: None (Preliminary result)   Collection Time: 02/21/19  6:15 PM   Specimen: BLOOD  Result Value Ref Range Status   Specimen Description   Final    BLOOD LEFT ANTECUBITAL Performed at [facility] [address] [place], [place] [address]    Special Requests   Final    BOTTLES DRAWN AEROBIC AND ANAEROBIC Blood Culture adequate volume Performed at [facility], [address] [place], [place] [address]    Culture   Final    NO GROWTH 3 DAYS Performed at [facility], [place] [address], [place], [place] [address]    Report Status PENDING  Incomplete  SARS Coronavirus 2 Ag (30 min TAT) - Nasal Swab (BD Veritor Kit)     Status: None   Collection Time: 02/21/19  6:16 PM   Specimen: Nasal Swab (BD Veritor Kit)  Result Value Ref Range Status   SARS Coronavirus 2 Ag NEGATIVE NEGATIVE Final    Comment: (NOTE) SARS-CoV-2 antigen NOT DETECTED.  Negative results are presumptive.  Negative results do not preclude SARS-CoV-2 infection and should not be used as the  sole basis for treatment or other patient management decisions, including infection  control decisions, particularly in the presence of clinical signs and  symptoms consistent with COVID-19, or in those who have been in contact with the virus.  Negative results must be combined with clinical observations, patient history, and epidemiological information. The expected result is Negative. Fact Sheet for Patients:  PodPark.tn Fact Sheet for Healthcare Providers: GiftContent.is This test is not yet approved or cleared by the [place] FDA and  has been authorized for detection and/or diagnosis of SARS-CoV-2 by FDA under an Emergency Use Authorization (EUA).  This EUA will remain in effect (meaning this test can be used) for the duration of  the COVID-19 de claration under Section 564(b)(1) of the Act, 21 U.S.C. section 360bbb-3(b)(1), unless the authorization is terminated or revoked sooner. Performed at [facility], [address] [place], [place] [address]   SARS Coronavirus 2 by RT PCR (hospital order, performed in [facility] hospital lab) Nasopharyngeal Nasopharyngeal Swab     Status: None   Collection Time: 02/21/19  7:07 PM   Specimen: Nasopharyngeal Swab  Result Value Ref Range Status   SARS Coronavirus 2 NEGATIVE NEGATIVE Final    Comment: Performed at [facility], [address] [place], [place] [address]  SARS CORONAVIRUS 2 (TAT 6-24 HRS) Nasopharyngeal Nasopharyngeal Swab     Status: None   Collection Time: 02/22/19  2:50 AM   Specimen: Nasopharyngeal Swab  Result Value Ref Range Status   SARS Coronavirus 2 NEGATIVE NEGATIVE Final    Comment: (NOTE) SARS-CoV-2 target nucleic acids are NOT DETECTED. The SARS-CoV-2 RNA is generally detectable in upper and lower respiratory specimens during the acute phase of infection. Negative results do not preclude SARS-CoV-2 infection, do not rule out co-infections with other pathogens, and should not be used as the sole basis for treatment or other patient management decisions. Negative results must be combined with clinical observations, patient history, and epidemiological information. The expected result is Negative. Fact Sheet for Patients: SugarRoll.be Fact Sheet for Healthcare  Providers: https://www.woods-mathews.com/ This test is not yet approved or cleared by the [place] FDA and  has been authorized for detection and/or diagnosis of SARS-CoV-2 by FDA under an Emergency Use Authorization (EUA). This EUA will remain  in effect (meaning this test can be used) for the duration of the COVID-19 declaration under Section 56 4(b)(1) of the Act, 21 U.S.C. section 360bbb-3(b)(1), unless the authorization is terminated or revoked sooner. Performed at [facility], [place] [address], [place], [place] [address]   Respiratory Panel by PCR     Status: None   Collection Time: 02/22/19  2:50 AM   Specimen: Nasopharyngeal Swab; Respiratory  Result Value Ref Range Status   Adenovirus NOT DETECTED NOT DETECTED Final   Coronavirus 229E NOT DETECTED NOT DETECTED Final    Comment: (NOTE) The Coronavirus on the Respiratory Panel, DOES NOT test for the novel  Coronavirus (2019 nCoV)    Coronavirus HKU1 NOT DETECTED NOT DETECTED Final   Coronavirus NL63 NOT DETECTED NOT DETECTED Final   Coronavirus OC43 NOT DETECTED NOT DETECTED Final   Metapneumovirus NOT DETECTED NOT DETECTED Final   Rhinovirus / Enterovirus NOT DETECTED NOT DETECTED Final   Influenza A NOT DETECTED NOT DETECTED Final   Influenza B NOT DETECTED NOT DETECTED Final   Parainfluenza Virus 1 NOT DETECTED NOT DETECTED Final   Parainfluenza Virus 2 NOT DETECTED NOT DETECTED Final   Parainfluenza  Virus 3 NOT DETECTED NOT DETECTED Final   Parainfluenza Virus 4 NOT DETECTED NOT DETECTED Final   Respiratory Syncytial Virus NOT DETECTED NOT DETECTED Final   Bordetella pertussis NOT DETECTED NOT DETECTED Final   Chlamydophila pneumoniae NOT DETECTED NOT DETECTED Final   Mycoplasma pneumoniae NOT DETECTED NOT DETECTED Final    Comment: Performed at [facility], [place] [address], [place], [place] [address]  MRSA PCR Screening     Status: None   Collection Time: 02/22/19  2:50 AM   Specimen:  Nasopharyngeal  Result Value Ref Range Status   MRSA by PCR NEGATIVE NEGATIVE Final    Comment:        The GeneXpert MRSA Assay (FDA approved for NASAL specimens only), is one component of a comprehensive MRSA colonization surveillance program. It is not intended to diagnose MRSA infection nor to guide or monitor treatment for MRSA infections. Performed at [facility], [place] [address] [place], [place] [address]          Radiology Studies: VAS [place] LOWER EXTREMITY VENOUS (DVT)  Result Date: 02/23/2019  Lower Venous DVTStudy Indications: Edema, and Pain.  Comparison Study: Performing Technologist: Baldwin Crown RDMS, RVT  Examination Guidelines: A complete evaluation includes B-mode imaging, spectral Doppler, color Doppler, and power Doppler as needed of all accessible portions of each vessel. Bilateral testing is considered an integral part of a complete examination. Limited examinations for reoccurring indications may be performed as noted. The reflux portion of the exam is performed with the patient in reverse Trendelenburg.  +---------+---------------+---------+-----------+----------+--------------+ RIGHT    CompressibilityPhasicitySpontaneityPropertiesThrombus Aging +---------+---------------+---------+-----------+----------+--------------+ CFV      Full           Yes      Yes                                 +---------+---------------+---------+-----------+----------+--------------+ SFJ      Full                                                        +---------+---------------+---------+-----------+----------+--------------+ FV Prox  Full                                                        +---------+---------------+---------+-----------+----------+--------------+ FV Mid   Full                                                        +---------+---------------+---------+-----------+----------+--------------+ FV DistalFull                                                         +---------+---------------+---------+-----------+----------+--------------+ PFV      Full                                                        +---------+---------------+---------+-----------+----------+--------------+  POP      Full           Yes      Yes                                 +---------+---------------+---------+-----------+----------+--------------+ PTV      Full                                                        +---------+---------------+---------+-----------+----------+--------------+ PERO     Full                                                        +---------+---------------+---------+-----------+----------+--------------+   +---------+---------------+---------+-----------+----------+--------------+ LEFT     CompressibilityPhasicitySpontaneityPropertiesThrombus Aging +---------+---------------+---------+-----------+----------+--------------+ CFV      Full           Yes      Yes                                 +---------+---------------+---------+-----------+----------+--------------+ SFJ      Full                                                        +---------+---------------+---------+-----------+----------+--------------+ FV Prox  Full                                                        +---------+---------------+---------+-----------+----------+--------------+ FV Mid   Full                                                        +---------+---------------+---------+-----------+----------+--------------+ FV DistalFull                                                        +---------+---------------+---------+-----------+----------+--------------+ PFV      Full                                                        +---------+---------------+---------+-----------+----------+--------------+ POP      Full           Yes      Yes                                  +---------+---------------+---------+-----------+----------+--------------+  PTV      Full                                                        +---------+---------------+---------+-----------+----------+--------------+ PERO     Full                                                        +---------+---------------+---------+-----------+----------+--------------+ Slow blood flow and rouleaux effect seen throughout, specifically left popliteal vein.    Summary: RIGHT: - There is no evidence of deep vein thrombosis in the lower extremity.  - No cystic structure found in the popliteal fossa.  LEFT: - There is no evidence of deep vein thrombosis in the lower extremity.  - No cystic structure found in the popliteal fossa.  *See table(s) above for measurements and observations. Electronically signed by Curt Jews MD on 02/23/2019 at 5:12:45 PM.    Final         Scheduled Meds: . aspirin  81 mg Oral Daily  . atenolol  25 mg Oral Daily  . atorvastatin  80 mg Oral Daily  . azithromycin  500 mg Oral Daily  . chlorhexidine  15 mL Mouth Rinse BID  . Chlorhexidine Gluconate Cloth  6 each Topical Daily  . dexamethasone (DECADRON) injection  6 mg Intravenous Q24H  . enoxaparin (LOVENOX) injection  40 mg Subcutaneous Q12H  . insulin aspart  0-9 Units Subcutaneous TID WC  . latanoprost  1 drop Both Eyes QHS  . linagliptin  5 mg Oral Daily  . mouth rinse  15 mL Mouth Rinse q12n4p  . tamsulosin  0.4 mg Oral Daily  . timolol  1 drop Both Eyes BID   Continuous Infusions: . cefTRIAXone (ROCEPHIN)  IV Stopped (02/24/19 0539)  . remdesivir 100 mg in NS 100 mL 100 mg (02/24/19 0945)     LOS: 3 days    Time spent: 20 minutes with over 50% of the time coordinating the patient's care    Harold Hedge, DO [facility] Pager [phone]  If 7PM-7AM, please contact night-coverage [email or web address] Password [email or web address] 02/24/2019, 1:46 PM

## 2019-02-24 NOTE — Progress Notes (Signed)
ANTICOAGULATION CONSULT NOTE - Initial Consult  Pharmacy Consult for LMWH Indication: VTE px  Allergies  Allergen Reactions  . Lisinopril Cough    Patient Measurements: Height: 6\' 1"  (185.4 cm) Weight: 175 lb 4.3 oz (79.5 kg) IBW/kg (Calculated) : 79.9 Heparin Dosing Weight:   Vital Signs: Temp: 97.9 F (36.6 C) (02/04 0800) Temp Source: Oral (02/04 0800) BP: 142/86 (02/04 0600) Pulse Rate: 69 (02/04 0600)  Labs: Recent Labs    02/21/19 1816 02/21/19 1816 02/21/19 2018 02/21/19 2226 02/22/19 0800 02/22/19 1314 02/22/19 1314 02/23/19 0307 02/23/19 1605 02/24/19 0229  HGB 14.9   < >  --    < >  --  13.1   < > 13.2  --  13.7  HCT 46.2   < >  --    < >  --  41.1  --  42.7  --  43.2  PLT 362   < >  --   --   --  362  --  389  --  395  CREATININE 1.41*   < >  --   --   --  1.49*   < > 1.51* 1.36* 1.14  TROPONINIHS 14  --  16  --  12  --   --   --   --   --    < > = values in this interval not displayed.    Estimated Creatinine Clearance: 66.8 mL/min (by C-G formula based on SCr of 1.14 mg/dL).   Medical History: Past Medical History:  Diagnosis Date  . Allergy   . Arthritis    right knee   . Diabetes ([place])   . Diverticulosis   . Glaucoma   . HTN (hypertension)   . Hyperlipidemia   . Hypogonadism in male   . Migraines   . Sleep apnea    uses cpap     Medications:  Facility-Administered Medications Prior to Admission  Medication Dose Route Frequency Provider Last Rate Last Admin  . 0.9 %  sodium chloride infusion  500 mL Intravenous Continuous Irene Shipper, MD       Medications Prior to Admission  Medication Sig Dispense Refill Last Dose  . aspirin 81 MG chewable tablet Chew 81 mg by mouth daily.   Past Week at Unknown time  . atenolol (TENORMIN) 25 MG tablet Take 25 mg by mouth daily.   Past Week at Unknown time  . atorvastatin (LIPITOR) 80 MG tablet Take 80 mg by mouth daily.   Past Week at Unknown time  . fluticasone (FLONASE) 50 MCG/ACT nasal  spray Place 1 spray into both nostrils daily as needed for allergies or rhinitis.   Past Month at Unknown time  . hydrochlorothiazide (HYDRODIURIL) 25 MG tablet Take 25 mg by mouth daily.   Past Week at Unknown time  . KRILL OIL PO Take 1 capsule by mouth daily.   Past Week at Unknown time  . latanoprost (XALATAN) 0.005 % ophthalmic solution Place 1 drop into both eyes at bedtime.    Past Week at Unknown time  . losartan (COZAAR) 50 MG tablet Take 50 mg by mouth daily.   Past Week at Unknown time  . metFORMIN (GLUCOPHAGE) 500 MG tablet Take 500 mg by mouth daily.   Past Week at Unknown time  . naproxen (NAPROSYN) 500 MG tablet Take 500 mg by mouth daily as needed for mild pain.    Past Month at Unknown time  . tamsulosin (FLOMAX) 0.4 MG CAPS capsule Take  0.4 mg by mouth daily.   Past Week at Unknown time  . timolol (TIMOPTIC) 0.5 % ophthalmic solution Place 1 drop into both eyes 2 (two) times daily.    Past Week at Unknown time    Assessment: 74 yo M with acute resp failure & suspected Covid PNA.  Pharmacy to dose LMWH for VTE px.  CT neg for PE.  Dopplers neg for DVT.   D dimer 6.57> > 20> 18.35> 5.86. BMI 23 CBC WNL, SCr down to 1.14.  No bleeding reported.   Plan:  LMWH 40 q12 for VTE px in presumed Covid + pt with D dimer > 5  Eudelia Bunch, Pharm.D 02/24/2019 1:14 PM

## 2019-02-25 LAB — D-DIMER, QUANTITATIVE: D-Dimer, Quant: 3.5 ug/mL-FEU — ABNORMAL HIGH (ref 0.00–0.50)

## 2019-02-25 LAB — CBC
HCT: 44 % (ref 39.0–52.0)
Hemoglobin: 14 g/dL (ref 13.0–17.0)
MCH: 26.6 pg (ref 26.0–34.0)
MCHC: 31.8 g/dL (ref 30.0–36.0)
MCV: 83.7 fL (ref 80.0–100.0)
Platelets: 375 10*3/uL (ref 150–400)
RBC: 5.26 MIL/uL (ref 4.22–5.81)
RDW: 14.3 % (ref 11.5–15.5)
WBC: 9.5 10*3/uL (ref 4.0–10.5)
nRBC: 0 % (ref 0.0–0.2)

## 2019-02-25 LAB — BASIC METABOLIC PANEL
Anion gap: 7 (ref 5–15)
BUN: 27 mg/dL — ABNORMAL HIGH (ref 8–23)
CO2: 25 mmol/L (ref 22–32)
Calcium: 9.1 mg/dL (ref 8.9–10.3)
Chloride: 113 mmol/L — ABNORMAL HIGH (ref 98–111)
Creatinine, Ser: 1.2 mg/dL (ref 0.61–1.24)
GFR calc Af Amer: >60 mL/min (ref >60)
GFR calc non Af Amer: >60 mL/min (ref >60)
Glucose, Bld: 202 mg/dL — ABNORMAL HIGH (ref 70–99)
Potassium: 4.6 mmol/L (ref 3.5–5.1)
Sodium: 145 mmol/L (ref 135–145)

## 2019-02-25 LAB — C-REACTIVE PROTEIN: CRP: 2.7 mg/dL — ABNORMAL HIGH (ref <1.0)

## 2019-02-25 LAB — FERRITIN: Ferritin: 1306 ng/mL — ABNORMAL HIGH (ref 24–336)

## 2019-02-25 NOTE — Progress Notes (Signed)
At 1900, RN changed patient's pulse ox, and patient's O2 sat was >95% on RA. Pt is currently resting in bed with eyes closed, O2 sat is 98% on RA. RN will continue to monitor O2 needs.

## 2019-02-25 NOTE — Plan of Care (Signed)
RN will continue to monitor patient's progression of the care plan.  

## 2019-02-25 NOTE — Progress Notes (Signed)
Pt down to 10L hfnc now.  Not able to follow comes.  At times unable to understand instructions given to him.  Tolerated up in chair well.  More periods of wakefulness today.

## 2019-02-25 NOTE — Progress Notes (Signed)
PROGRESS NOTE    Jerry Caldwell.    Code Status: Full Code  FXT:[id number] DOB: Dec 20, 1945 DOA: 02/21/2019  PCP: Haywood Pao, MD    Hospital Summary  73 y.o.male,w hypertension, hyperlipidemia, Dm2, Bph, OSA presents with dyspnea x 3 days with hypoxia in ED. Despite symptoms and lab values consistent with COVID-19, patient has had negative respiratory panel as well as COVID-19 negative so far and has been on treatment for CAP as well as COVID-19 with remdesivir and steroids. Significantly elevated initial D-dimer > 20, started on full dose lovenox.  Negative CTA chest for PE, negative bilateral lower extremity Doppler for DVT. D Dimer decreased and lovenox changed to intermediate dosing   A & P   Principal Problem:   Acute respiratory failure with hypoxia (HCC) Active Problems:   Suspected COVID-19 virus infection   Pneumonia   Acute respiratory failure with hypoxia CAP vs likely covid-19   CAP  -Blood culture x2  no growth to date -Urine strep and legionella antigens negative -MRSA swab neg, thus vanocmycin discontinued -Completed 5 days Rocephin/azithromycin, discontinue  Presumed covid-19 infection -COVID antigen neg, COVID RNA negative -Positive COVID-19 contact in wife -CRP elevated at 9.8->2.7, elevated ferritin, neg procal -Discussed with ID, okay to continue on empiric remdesivir and decadron -Ddimer >20->5.86->3.5. CTA chest reviewed, neg for PE but does confirm marked diffuse B infiltrates -Continues on 15L high flow O2, Try to wean oxygen for SPO2> 92%, consider CXR if unable to wean -Incentive spirometry -Continue Lovenox at intermediate dosing per pharmacy  Hypernatremia - resolved with D5W  ARF, unknown baseline -Likely from dehydration, resolved with D5W -Renal US reviewed. Findings of medical renal disease  Dementia -Oriented x2 suspect at/near his baseline -Delirium precautions  Dm2 -glucose presently stable -Continue sliding  scale 3 times daily with meals -Continue Tradjenta  Hypertension -Cont Atenolol 18m po qday as tolerated -Holding Hydrochlorothiazide 2100mpo qday given concerns of dehydration -Holding Losartan 504mo qday given ARF  Hyperlipidemia -Cont Lipitor 86m23m qhs   Bph -Cont Flomax as tolerated  Glaucoma -Cont Timoptic and Xalatan as tolerated  DVT prophylaxis: Lovenox per pharmacy Family Communication: Patient's daughter has been updated by phone 2/5 Disposition Plan: Continue inpatient work-up and management as above.  Barrier to discharge is high O2 requirements  Consultants  None  Procedures  None  Antibiotics   Anti-infectives (From admission, onward)   Start     Dose/Rate Route Frequency Ordered Stop   02/23/19 1000  remdesivir 100 mg in sodium chloride 0.9 % 100 mL IVPB     100 mg 200 mL/hr over 30 Minutes Intravenous Daily 02/22/19 0721 02/27/19 0959   02/23/19 1000  azithromycin (ZITHROMAX) tablet 500 mg     500 mg Oral Daily 02/23/19 0725     02/22/19 1000  remdesivir 200 mg in sodium chloride 0.9% 250 mL IVPB     200 mg 580 mL/hr over 30 Minutes Intravenous Once 02/22/19 0721 02/22/19 1900   02/22/19 0800  vancomycin (VANCOREADY) IVPB 1750 mg/350 mL  Status:  Discontinued     1,750 mg 175 mL/hr over 120 Minutes Intravenous Every 24 hours 02/22/19 0311 02/22/19 0733   02/22/19 0400  cefTRIAXone (ROCEPHIN) 2 g in sodium chloride 0.9 % 100 mL IVPB     2 g 200 mL/hr over 30 Minutes Intravenous Daily 02/22/19 0256     02/22/19 0300  azithromycin (ZITHROMAX) 500 mg in sodium chloride 0.9 % 250 mL IVPB  Status:  Discontinued  500 mg 250 mL/hr over 60 Minutes Intravenous Daily 02/22/19 0256 02/23/19 0725   02/21/19 2130  vancomycin (VANCOCIN) IVPB 1000 mg/200 mL premix     1,000 mg 200 mL/hr over 60 Minutes Intravenous  Once 02/21/19 2118 02/22/19 0007   02/21/19 2130  piperacillin-tazobactam (ZOSYN) IVPB 3.375 g     3.375 g 12.5 mL/hr over 240 Minutes  Intravenous  Once 02/21/19 2118 02/21/19 2300           Subjective   Sitting upright in chair no acute distress resting comfortably.  Denies any complaints and states he feels well today.  About to eat lunch.  Still on 15 L/min of oxygen.  Objective   Vitals:   02/25/19 0628 02/25/19 0800 02/25/19 1200 02/25/19 1300  BP:      Pulse:   73   Resp:   (!) 24 (!) 30  Temp:  98.2 F (36.8 C)    TempSrc:  Oral    SpO2:   91%   Weight: 78.6 kg     Height:        Intake/Output Summary (Last 24 hours) at 02/25/2019 1345 Last data filed at 02/25/2019 0600 Gross per 24 hour  Intake 204.54 ml  Output 3700 ml  Net -3495.46 ml   Filed Weights   02/22/19 0225 02/24/19 0500 02/25/19 0628  Weight: 97 kg 79.5 kg 78.6 kg    Examination:  Physical Exam Vitals and nursing note reviewed.  Constitutional:      General: He is not in acute distress. HENT:     Head: Normocephalic.     Mouth/Throat:     Mouth: Mucous membranes are moist.  Eyes:     Conjunctiva/sclera: Conjunctivae normal.  Cardiovascular:     Rate and Rhythm: Normal rate.  Pulmonary:     Effort: Pulmonary effort is normal.     Breath sounds: Normal breath sounds.  Abdominal:     General: Abdomen is flat. There is no distension.  Musculoskeletal:        General: No swelling or tenderness.  Neurological:     Mental Status: He is alert.     Comments: Oriented x2, suspect near baseline dementia  Psychiatric:        Mood and Affect: Mood normal.        Behavior: Behavior normal.     Data Reviewed: I have personally reviewed following labs and imaging studies  CBC: Recent Labs  Lab 02/21/19 1816 02/21/19 1816 02/21/19 2226 02/22/19 1314 02/23/19 0307 02/24/19 0229 02/25/19 0213  WBC 7.7  --   --  8.5 11.5* 11.0* 9.5  NEUTROABS 6.1  --   --   --  9.9* 9.3*  --   HGB 14.9   < > 12.9* 13.1 13.2 13.7 14.0  HCT 46.2   < > 38.0* 41.1 42.7 43.2 44.0  MCV 81.9  --   --  83.5 84.9 83.6 83.7  PLT 362  --   --   362 389 395 375   < > = values in this interval not displayed.   Basic Metabolic Panel: Recent Labs  Lab 02/22/19 1314 02/23/19 0307 02/23/19 1605 02/24/19 0229 02/25/19 0213  NA 150* 148* 146* 143 145  K 4.3 4.3 3.7 3.9 4.6  CL 113* 113* 109 107 113*  CO2 '25 23 26 24 25  ' GLUCOSE 162* 168* 158* 147* 202*  BUN 33* 39* 38* 32* 27*  CREATININE 1.49* 1.51* 1.36* 1.14 1.20  CALCIUM 9.2 9.1 9.2 9.1  9.1  MG  --   --   --  2.5*  --    GFR: Estimated Creatinine Clearance: 62.8 mL/min (by C-G formula based on SCr of 1.2 mg/dL). Liver Function Tests: Recent Labs  Lab 02/21/19 1816 02/22/19 1314 02/23/19 0307 02/24/19 0229  AST 39 26 27 36  ALT '24 21 22 30  ' ALKPHOS 76 68 62 63  BILITOT 1.2 1.0 1.1 1.0  PROT 8.6* 7.5 7.2 6.9  ALBUMIN 3.2* 2.9* 2.8* 2.6*   No results for input(s): LIPASE, AMYLASE in the last 168 hours. No results for input(s): AMMONIA in the last 168 hours. Coagulation Profile: No results for input(s): INR, PROTIME in the last 168 hours. Cardiac Enzymes: No results for input(s): CKTOTAL, CKMB, CKMBINDEX, TROPONINI in the last 168 hours. BNP (last 3 results) No results for input(s): PROBNP in the last 8760 hours. HbA1C: No results for input(s): HGBA1C in the last 72 hours. CBG: Recent Labs  Lab 02/23/19 2007 02/24/19 0733 02/24/19 1215 02/24/19 1630 02/24/19 2130  GLUCAP 173* 179* 137* 120* 141*   Lipid Profile: No results for input(s): CHOL, HDL, LDLCALC, TRIG, CHOLHDL, LDLDIRECT in the last 72 hours. Thyroid Function Tests: No results for input(s): TSH, T4TOTAL, FREET4, T3FREE, THYROIDAB in the last 72 hours. Anemia Panel: Recent Labs    02/24/19 0229 02/25/19 0213  FERRITIN 1,668* 1,306*   Sepsis Labs: Recent Labs  Lab 02/21/19 1816 02/21/19 2348 02/22/19 0310  PROCALCITON <0.10  --   --   LATICACIDVEN 3.0* 1.5 1.9    Recent Results (from the past 240 hour(s))  Blood culture (routine x 2)     Status: None (Preliminary result)    Collection Time: 02/21/19  6:10 PM   Specimen: BLOOD  Result Value Ref Range Status   Specimen Description   Final    BLOOD LEFT ANTECUBITAL Performed at [facility], [address] [place], [place] [address]    Special Requests   Final    BOTTLES DRAWN AEROBIC AND ANAEROBIC Blood Culture results may not be optimal due to an inadequate volume of blood received in culture bottles   Culture   Final    NO GROWTH 4 DAYS Performed at [facility] [address], [place], [place] [address]    Report Status PENDING  Incomplete  Blood culture (routine x 2)     Status: None (Preliminary result)   Collection Time: 02/21/19  6:15 PM   Specimen: BLOOD  Result Value Ref Range Status   Specimen Description   Final    BLOOD LEFT ANTECUBITAL Performed at [facility], [address] [place], [place] [address]    Special Requests   Final    BOTTLES DRAWN AEROBIC AND ANAEROBIC Blood Culture adequate volume Performed at [facility], [address] [place], [place] [address]    Culture   Final    NO GROWTH 4 DAYS Performed at [facility], [place] [address], [place], [place] [address]    Report Status PENDING  Incomplete  SARS Coronavirus 2 Ag (30 min TAT) - Nasal Swab (BD Veritor Kit)     Status: None   Collection Time: 02/21/19  6:16 PM   Specimen: Nasal Swab (BD Veritor Kit)  Result Value Ref Range Status   SARS Coronavirus 2 Ag NEGATIVE NEGATIVE Final    Comment: (NOTE) SARS-CoV-2 antigen NOT DETECTED.  Negative results are presumptive.  Negative results do not preclude SARS-CoV-2 infection  and should not be used as the sole basis for treatment or other patient management decisions, including infection  control decisions, particularly in the presence of clinical signs and  symptoms consistent with COVID-19, or in those who have been in contact with the virus.  Negative results must be combined with clinical observations, patient history, and  epidemiological information. The expected result is Negative. Fact Sheet for Patients: PodPark.tn Fact Sheet for Healthcare Providers: GiftContent.is This test is not yet approved or cleared by the [place] FDA and  has been authorized for detection and/or diagnosis of SARS-CoV-2 by FDA under an Emergency Use Authorization (EUA).  This EUA will remain in effect (meaning this test can be used) for the duration of  the COVID-19 de claration under Section 564(b)(1) of the Act, 21 U.S.C. section 360bbb-3(b)(1), unless the authorization is terminated or revoked sooner. Performed at [facility], [address] [place], [place] [address]   SARS Coronavirus 2 by RT PCR (hospital order, performed in [facility] hospital lab) Nasopharyngeal Nasopharyngeal Swab     Status: None   Collection Time: 02/21/19  7:07 PM   Specimen: Nasopharyngeal Swab  Result Value Ref Range Status   SARS Coronavirus 2 NEGATIVE NEGATIVE Final    Comment: Performed at [facility], [address] [place], [place] [address]  SARS CORONAVIRUS 2 (TAT 6-24 HRS) Nasopharyngeal Nasopharyngeal Swab     Status: None   Collection Time: 02/22/19  2:50 AM   Specimen: Nasopharyngeal Swab  Result Value Ref Range Status   SARS Coronavirus 2 NEGATIVE NEGATIVE Final    Comment: (NOTE) SARS-CoV-2 target nucleic acids are NOT DETECTED. The SARS-CoV-2 RNA is generally detectable in upper and lower respiratory specimens during the acute phase of infection. Negative results do not preclude SARS-CoV-2 infection, do not rule out co-infections with other pathogens, and should not be used as the sole basis for treatment or other patient management decisions. Negative results must be combined with clinical observations, patient history, and epidemiological information. The expected result is Negative. Fact Sheet for  Patients: SugarRoll.be Fact Sheet for Healthcare Providers: https://www.woods-mathews.com/ This test is not yet approved or cleared by the [place] FDA and  has been authorized for detection and/or diagnosis of SARS-CoV-2 by FDA under an Emergency Use Authorization (EUA). This EUA will remain  in effect (meaning this test can be used) for the duration of the COVID-19 declaration under Section 56 4(b)(1) of the Act, 21 U.S.C. section 360bbb-3(b)(1), unless the authorization is terminated or revoked sooner. Performed at [facility], [place] [address], [place], [place] [address]   Respiratory Panel by PCR     Status: None   Collection Time: 02/22/19  2:50 AM   Specimen: Nasopharyngeal Swab; Respiratory  Result Value Ref Range Status   Adenovirus NOT DETECTED NOT DETECTED Final   Coronavirus 229E NOT DETECTED NOT DETECTED Final    Comment: (NOTE) The Coronavirus on the Respiratory Panel, DOES NOT test for the novel  Coronavirus (2019 nCoV)    Coronavirus HKU1 NOT DETECTED NOT DETECTED Final   Coronavirus NL63 NOT DETECTED NOT DETECTED Final   Coronavirus OC43 NOT DETECTED NOT DETECTED Final   Metapneumovirus NOT DETECTED NOT DETECTED Final   Rhinovirus / Enterovirus NOT DETECTED NOT DETECTED Final   Influenza A NOT DETECTED NOT DETECTED Final   Influenza B NOT DETECTED NOT DETECTED Final   Parainfluenza Virus 1 NOT DETECTED NOT DETECTED Final   Parainfluenza Virus 2 NOT  DETECTED NOT DETECTED Final   Parainfluenza Virus 3 NOT DETECTED NOT DETECTED Final   Parainfluenza Virus 4 NOT DETECTED NOT DETECTED Final   Respiratory Syncytial Virus NOT DETECTED NOT DETECTED Final   Bordetella pertussis NOT DETECTED NOT DETECTED Final   Chlamydophila pneumoniae NOT DETECTED NOT DETECTED Final   Mycoplasma pneumoniae NOT DETECTED NOT DETECTED Final    Comment: Performed at [facility], [place] [address], [place], [place] [address]  MRSA PCR  Screening     Status: None   Collection Time: 02/22/19  2:50 AM   Specimen: Nasopharyngeal  Result Value Ref Range Status   MRSA by PCR NEGATIVE NEGATIVE Final    Comment:        The GeneXpert MRSA Assay (FDA approved for NASAL specimens only), is one component of a comprehensive MRSA colonization surveillance program. It is not intended to diagnose MRSA infection nor to guide or monitor treatment for MRSA infections. Performed at [facility], [place] [address], [place], [place] [address]          Radiology Studies: No results found.      Scheduled Meds: . aspirin  81 mg Oral Daily  . atenolol  25 mg Oral Daily  . atorvastatin  80 mg Oral Daily  . azithromycin  500 mg Oral Daily  . chlorhexidine  15 mL Mouth Rinse BID  . Chlorhexidine Gluconate Cloth  6 each Topical Daily  . dexamethasone (DECADRON) injection  6 mg Intravenous Q24H  . enoxaparin (LOVENOX) injection  40 mg Subcutaneous Q12H  . insulin aspart  0-9 Units Subcutaneous TID WC  . latanoprost  1 drop Both Eyes QHS  . linagliptin  5 mg Oral Daily  . mouth rinse  15 mL Mouth Rinse q12n4p  . tamsulosin  0.4 mg Oral Daily  . timolol  1 drop Both Eyes BID   Continuous Infusions: . cefTRIAXone (ROCEPHIN)  IV Stopped (02/25/19 0910)  . remdesivir 100 mg in NS 100 mL Stopped (02/25/19 0945)     LOS: 4 days    Time spent: 23 minutes with over 50% of the time coordinating the patient's care    Harold Hedge, DO [facility] Pager [phone]  If 7PM-7AM, please contact night-coverage [email or web address] Password [email or web address] 02/25/2019, 1:45 PM

## 2019-02-25 NOTE — Plan of Care (Signed)
  Problem: Activity: Goal: Risk for activity intolerance will decrease Outcome: Progressing   Problem: Nutrition: Goal: Adequate nutrition will be maintained Outcome: Progressing   

## 2019-02-26 LAB — BASIC METABOLIC PANEL
Anion gap: 11 (ref 5–15)
BUN: 28 mg/dL — ABNORMAL HIGH (ref 8–23)
CO2: 20 mmol/L — ABNORMAL LOW (ref 22–32)
Calcium: 9.1 mg/dL (ref 8.9–10.3)
Chloride: 114 mmol/L — ABNORMAL HIGH (ref 98–111)
Creatinine, Ser: 1.19 mg/dL (ref 0.61–1.24)
GFR calc Af Amer: >60 mL/min (ref >60)
GFR calc non Af Amer: >60 mL/min (ref >60)
Glucose, Bld: 195 mg/dL — ABNORMAL HIGH (ref 70–99)
Potassium: 4.1 mmol/L (ref 3.5–5.1)
Sodium: 145 mmol/L (ref 135–145)

## 2019-02-26 LAB — CBC
HCT: 43.3 % (ref 39.0–52.0)
Hemoglobin: 13.8 g/dL (ref 13.0–17.0)
MCH: 26.5 pg (ref 26.0–34.0)
MCHC: 31.9 g/dL (ref 30.0–36.0)
MCV: 83.3 fL (ref 80.0–100.0)
Platelets: 431 10*3/uL — ABNORMAL HIGH (ref 150–400)
RBC: 5.2 MIL/uL (ref 4.22–5.81)
RDW: 14.3 % (ref 11.5–15.5)
WBC: 11.5 10*3/uL — ABNORMAL HIGH (ref 4.0–10.5)
nRBC: 0 % (ref 0.0–0.2)

## 2019-02-26 LAB — GLUCOSE, CAPILLARY
Glucose-Capillary: 169 mg/dL — ABNORMAL HIGH (ref 70–99)
Glucose-Capillary: 137 mg/dL — ABNORMAL HIGH (ref 70–99)
Glucose-Capillary: 98 mg/dL (ref 70–99)
Glucose-Capillary: 109 mg/dL — ABNORMAL HIGH (ref 70–99)

## 2019-02-26 LAB — C-REACTIVE PROTEIN: CRP: 2.7 mg/dL — ABNORMAL HIGH (ref <1.0)

## 2019-02-26 LAB — FERRITIN: Ferritin: 1175 ng/mL — ABNORMAL HIGH (ref 24–336)

## 2019-02-26 LAB — D-DIMER, QUANTITATIVE: D-Dimer, Quant: 3.76 ug/mL-FEU — ABNORMAL HIGH (ref 0.00–0.50)

## 2019-02-26 LAB — CULTURE, BLOOD (ROUTINE X 2)
Culture: NO GROWTH
Culture: NO GROWTH
Special Requests: ADEQUATE

## 2019-02-26 LAB — MAGNESIUM: Magnesium: 2.3 mg/dL (ref 1.7–2.4)

## 2019-02-26 NOTE — Evaluation (Signed)
Physical Therapy Evaluation Patient Details Name: Jerry Caldwell. MRN: [id number] DOB: 1945/07/30 Today's Date: 02/26/2019   History of Present Illness  Pt admitted from home with acute resp failure 2* CAP vs COVID (test is negative but still suspicious based on symptoms and imaging).  Pt with hx of DM,Glaucoma, and dementia  Clinical Impression  Pt admitted as above and presenting with functional mobility limitations 2* generalized weakness, balance deficits, cognitive deficits and very limited endurance (desat to 84% with transfer bed to chair).  Pt would benefit from dc to home with 24/7 assist of family and HHPT follow up but if family is unable to provide level of assist pt requires at point of dc, SNF is recommended.    Follow Up Recommendations Home health PT;SNF;Supervision/Assistance - 24 hour(dependent on family ability to provide assistance needed)    Equipment Recommendations  Other (comment)(TBD)    Recommendations for Other Services OT consult     Precautions / Restrictions Precautions Precautions: Fall Restrictions Weight Bearing Restrictions: No      Mobility  Bed Mobility Overal bed mobility: Needs Assistance Bed Mobility: Supine to Sit     Supine to sit: Supervision     General bed mobility comments: Increased time with use of bedrails but no physical assist  Transfers Overall transfer level: Needs assistance Equipment used: None Transfers: Sit to/from Stand;Stand Pivot Transfers Sit to Stand: Min assist Stand pivot transfers: Min assist       General transfer comment: pt transferred bed to chair stand pvt with steady assist only  Ambulation/Gait             General Gait Details: transferred to chair only - c/o SOB and O2 sats dropped to 84% - RN present and restarting O2  Stairs            Wheelchair Mobility    Modified Rankin (Stroke Patients Only)       Balance Overall balance assessment: Needs assistance   Sitting  balance-Leahy Scale: Good       Standing balance-Leahy Scale: Fair Standing balance comment: with performance of functional activities                             Pertinent Vitals/Pain Pain Assessment: No/denies pain    Home Living Family/patient expects to be discharged to:: Private residence                 Additional Comments: Unsure - RN has discussed with family who wish to avoid SNF placement and take pt home but uncertain to whose home    Prior Function Level of Independence: Independent         Comments: Pt is not reliable historian     Hand Dominance        Extremity/Trunk Assessment   Upper Extremity Assessment Upper Extremity Assessment: Generalized weakness    Lower Extremity Assessment Lower Extremity Assessment: Generalized weakness       Communication   Communication: No difficulties  Cognition Arousal/Alertness: Awake/alert Behavior During Therapy: Impulsive;Flat affect Overall Cognitive Status: No family/caregiver present to determine baseline cognitive functioning                                 General Comments: Difficulty following simple cues without repetition      General Comments      Exercises     Assessment/Plan  PT Assessment Patient needs continued PT services  PT Problem List Decreased strength;Decreased activity tolerance;Decreased balance;Decreased mobility;Decreased cognition;Decreased knowledge of use of DME;Decreased safety awareness       PT Treatment Interventions DME instruction;Gait training;Stair training;Functional mobility training;Therapeutic activities;Therapeutic exercise;Balance training;Patient/family education;Cognitive remediation    PT Goals (Current goals can be found in the Care Plan section)  Acute Rehab PT Goals Patient Stated Goal: No goals expressed PT Goal Formulation: With patient Time For Goal Achievement: 03/12/19 Potential to Achieve Goals: Fair     Frequency Min 3X/week   Barriers to discharge Other (comment) Not clear on pt options for dc to family home - spouse died ~ 2 weeks ago     Co-evaluation               AM-PAC PT "6 Clicks" Mobility  Outcome Measure Help needed turning from your back to your side while in a flat bed without using bedrails?: None Help needed moving from lying on your back to sitting on the side of a flat bed without using bedrails?: None Help needed moving to and from a bed to a chair (including a wheelchair)?: A Little Help needed standing up from a chair using your arms (e.g., wheelchair or bedside chair)?: A Little Help needed to walk in hospital room?: A Lot Help needed climbing 3-5 steps with a railing? : A Lot 6 Click Score: 18    End of Session Equipment Utilized During Treatment: Gait belt Activity Tolerance: Patient limited by fatigue Patient left: in chair;with call bell/phone within reach;with chair alarm set;with nursing/sitter in room   PT Visit Diagnosis: Muscle weakness (generalized) (M62.81);Difficulty in walking, not elsewhere classified (R26.2);Unsteadiness on feet (R26.81)    Time: 1205-1228 PT Time Calculation (min) (ACUTE ONLY): 23 min   Charges:   PT Evaluation $PT Eval Low Complexity: 1 Low          Debe Coder PT Acute Rehabilitation Services Pager [phone] Office [phone]   Karrina Lye 02/26/2019, 3:07 PM

## 2019-02-26 NOTE — Discharge Summary (Signed)
Physician Discharge Summary  Jerry Caldwell. WVP:[id number] DOB: 09-17-1945 DOA: 02/21/2019  PCP: Haywood Pao, MD  Admit date: 02/21/2019 Discharge date: 02/28/2019   Code Status: Full Code  Admitted From: home Discharged to:  home Home Health: PT/RN/NA Equipment/Devices: Walker Discharge Condition:stable   Recommendations for Outpatient Follow-up   1. Follow up with PCP in 1 week with lab work 2. Losartan and HCTZ discontinued due to normotension and AKI.  Reevaluate as outpatient 3. Reevaluate dementia.  Started to have mild hospital-acquired confusion which should improve once discharged home to a familiar environment  Hospital Summary  74 y.o.male,w hypertension, hyperlipidemia, Dm2, Bph, OSA presents with dyspnea x 3 days with hypoxia in ED. Despite symptoms and lab values consistent with COVID-19, patient has had negative respiratory panel as well as COVID-19 negative so far and has been on treatment for CAP as well as COVID-19 with remdesivir and steroids. Significantly elevated initial D-dimer > 20, started on full dose lovenox.  Negative CTA chest for PE, negative bilateral lower extremity Doppler for DVT. D Dimer decreased and lovenox changed to intermediate dosing.  Patient had prolonged hospital stay due to high O2 requirements, 15 L/min HF [place]. This was weaned down to RA on 2/6.  Patient tolerated room air at rest and on ambulation and worked well with PT prior to discharge.  Of note, patient did report he started having some confusion while in the hospital on 2/8 and was having difficulty remembering the past couple weeks of events and he was concerned about this.  He was thought to have been developing mild hospital-acquired delirium which should resolve once patient is at home in a familiar environment.  This should be followed up outpatient   A & P   Principal Problem:   Acute respiratory failure with hypoxia (HCC) Active Problems:   Suspected COVID-19 virus  infection   Pneumonia   Sepsis with Acute respiratory failure with hypoxia secondary to CAP and likelycovid-19 Sepsis on admission with elevated lactate, hypoxia, tachycardia presumed COVID 19/CAP. Sepsis as resolved. Tolerating room air -Blood culture x2no growth to date -Urine strepand legionellaantigens negative -MRSA swab neg, thus vanocmycin discontinued -Completed 5 days Rocephin/azithromycin -ambulatory pulse ox  Presumed covid-19 infection -COVID antigen neg, COVID RNA negative -Positive COVID-19 contact in wife -CRP elevated at 9.8on admission, elevated ferritin, neg procal -Treat empirically for COVID 19 with 5 days remdesivir  -Completed 7 days Decadron, discontinued 2/8 as not requiring O2 and having some hospital-acquired confusion. -Ddimer >20 on admission, CTA chest neg for PE but does confirm marked diffuse B infiltrates.  -No need to discharge on high-dose aspirin for VTE prophylaxis with elevated D-dimer and patient with increased fall risk  Steroid-induced hyperglycemia Dm2 -treated with Tradjenta and sliding scale -discharge on home regimen  Dementia Mild hospital-acquired delirium -Started having some confusion on 2/8 which the patient was slightly concerned about.  This should resolve once home and with family will likely worsen if he stays hospitalized and this was discussed with his daughter over the phone -Discontinue Decadron  Hypernatremia - resolved with D5W  ARF, unknown baseline -Likely from dehydration, resolved with D5W -Renal US reviewed. Findings of medical renal disease  Hypertension -Cont Atenolol 43m po qdayas tolerated -Stable off HCTZ and losartan which were held due to AKI  Hyperlipidemia -Cont Lipitor  Bph -Cont Flomaxas tolerated  Glaucoma -Cont TimopticandXalatanas tolerated    Consultants  . none  Procedures  . none  Antibiotics   Anti-infectives (From admission, onward)  Start      Dose/Rate Route Frequency Ordered Stop   02/23/19 1000  remdesivir 100 mg in sodium chloride 0.9 % 100 mL IVPB     100 mg 200 mL/hr over 30 Minutes Intravenous Daily 02/22/19 0721 02/26/19 1019   02/23/19 1000  azithromycin (ZITHROMAX) tablet 500 mg  Status:  Discontinued     500 mg Oral Daily 02/23/19 0725 02/25/19 1346   02/22/19 1000  remdesivir 200 mg in sodium chloride 0.9% 250 mL IVPB     200 mg 580 mL/hr over 30 Minutes Intravenous Once 02/22/19 0721 02/22/19 1900   02/22/19 0800  vancomycin (VANCOREADY) IVPB 1750 mg/350 mL  Status:  Discontinued     1,750 mg 175 mL/hr over 120 Minutes Intravenous Every 24 hours 02/22/19 0311 02/22/19 0733   02/22/19 0400  cefTRIAXone (ROCEPHIN) 2 g in sodium chloride 0.9 % 100 mL IVPB  Status:  Discontinued     2 g 200 mL/hr over 30 Minutes Intravenous Daily 02/22/19 0256 02/25/19 1346   02/22/19 0300  azithromycin (ZITHROMAX) 500 mg in sodium chloride 0.9 % 250 mL IVPB  Status:  Discontinued     500 mg 250 mL/hr over 60 Minutes Intravenous Daily 02/22/19 0256 02/23/19 0725   02/21/19 2130  vancomycin (VANCOCIN) IVPB 1000 mg/200 mL premix     1,000 mg 200 mL/hr over 60 Minutes Intravenous  Once 02/21/19 2118 02/22/19 0007   02/21/19 2130  piperacillin-tazobactam (ZOSYN) IVPB 3.375 g     3.375 g 12.5 mL/hr over 240 Minutes Intravenous  Once 02/21/19 2118 02/21/19 2300        Subjective  Reports that he has some confusion today.  Having a hard time piecing together the past several days and weeks.  Knows that he is in the hospital but not sure for what.  He seems slightly concerned that he is lost the sense of orientation.  Otherwise denies any shortness of breath chest pain or palpitations or any other complaints.  Objective   Discharge Exam: Vitals:   02/28/19 1207 02/28/19 1407  BP: 127/84 112/88  Pulse: 64 74  Resp: 18   Temp: 98 F (36.7 C)   SpO2: 95% 92%   Vitals:   02/28/19 0500 02/28/19 0553 02/28/19 1207 02/28/19 1407   BP:  135/87 127/84 112/88  Pulse:  67 64 74  Resp:  20 18   Temp:  97.8 F (36.6 C) 98 F (36.7 C)   TempSrc:  Oral Oral   SpO2:  90% 95% 92%  Weight: 78.4 kg     Height:       Examination:  Physical Exam Vitals and nursing note reviewed.  Constitutional:      General: He is not in acute distress.    Appearance: Normal appearance.  HENT:     Head: Normocephalic and atraumatic.  Cardiovascular:     Rate and Rhythm: Normal rate and regular rhythm.  Pulmonary:     Effort: Pulmonary effort is normal.     Breath sounds: Normal breath sounds.  Abdominal:     General: Abdomen is flat.     Palpations: Abdomen is soft.  Neurological:     Mental Status: He is alert.     Comments: Oriented to place and person.   Poor memory  Psychiatric:        Mood and Affect: Mood normal.        Behavior: Behavior normal.      The results of significant diagnostics from this hospitalization (  including imaging, microbiology, ancillary and laboratory) are listed below for reference.     Microbiology: Recent Results (from the past 240 hour(s))  Blood culture (routine x 2)     Status: None   Collection Time: 02/21/19  6:10 PM   Specimen: BLOOD  Result Value Ref Range Status   Specimen Description   Final    BLOOD LEFT ANTECUBITAL Performed at [facility], [name] [place], [place] [address]    Special Requests   Final    BOTTLES DRAWN AEROBIC AND ANAEROBIC Blood Culture results may not be optimal due to an inadequate volume of blood received in culture bottles   Culture   Final    NO GROWTH 5 DAYS Performed at [facility], [place] [address], [place], [place] [address]    Report Status 02/26/2019 FINAL  Final  Blood culture (routine x 2)     Status: None   Collection Time: 02/21/19  6:15 PM   Specimen: BLOOD  Result Value Ref Range Status   Specimen Description   Final    BLOOD LEFT ANTECUBITAL Performed at [facility], [address]  [place], [place] [address]    Special Requests   Final    BOTTLES DRAWN AEROBIC AND ANAEROBIC Blood Culture adequate volume Performed at [facility], [address] [place], [place] [address]    Culture   Final    NO GROWTH 5 DAYS Performed at [facility], [place] [address] [place], [place] [address]    Report Status 02/26/2019 FINAL  Final  SARS Coronavirus 2 Ag (30 min TAT) - Nasal Swab (BD Veritor Kit)     Status: None   Collection Time: 02/21/19  6:16 PM   Specimen: Nasal Swab (BD Veritor Kit)  Result Value Ref Range Status   SARS Coronavirus 2 Ag NEGATIVE NEGATIVE Final    Comment: (NOTE) SARS-CoV-2 antigen NOT DETECTED.  Negative results are presumptive.  Negative results do not preclude SARS-CoV-2 infection and should not be used as the sole basis for treatment or other patient management decisions, including infection  control decisions, particularly in the presence of clinical signs and  symptoms consistent with COVID-19, or in those who have been in contact with the virus.  Negative results must be combined with clinical observations, patient history, and epidemiological information. The expected result is Negative. Fact Sheet for Patients: PodPark.tn Fact Sheet for Healthcare Providers: GiftContent.is This test is not yet approved or cleared by the [place] FDA and  has been authorized for detection and/or diagnosis of SARS-CoV-2 by FDA under an Emergency Use Authorization (EUA).  This EUA will remain in effect (meaning this test can be used) for the duration of  the COVID-19 de claration under Section 564(b)(1) of the Act, 21 U.S.C. section 360bbb-3(b)(1), unless the authorization is terminated or revoked sooner. Performed at [facility], [address] [place], [place] [address]   SARS Coronavirus 2 by RT PCR (hospital order, performed in [facility] hospital lab) Nasopharyngeal  Nasopharyngeal Swab     Status: None   Collection Time: 02/21/19  7:07 PM   Specimen: Nasopharyngeal Swab  Result Value Ref Range Status   SARS Coronavirus 2 NEGATIVE NEGATIVE Final    Comment: Performed at [facility], [address] [place], [place] [address]  SARS CORONAVIRUS 2 (TAT 6-24 HRS) Nasopharyngeal Nasopharyngeal Swab     Status: None  Collection Time: 02/22/19  2:50 AM   Specimen: Nasopharyngeal Swab  Result Value Ref Range Status   SARS Coronavirus 2 NEGATIVE NEGATIVE Final    Comment: (NOTE) SARS-CoV-2 target nucleic acids are NOT DETECTED. The SARS-CoV-2 RNA is generally detectable in upper and lower respiratory specimens during the acute phase of infection. Negative results do not preclude SARS-CoV-2 infection, do not rule out co-infections with other pathogens, and should not be used as the sole basis for treatment or other patient management decisions. Negative results must be combined with clinical observations, patient history, and epidemiological information. The expected result is Negative. Fact Sheet for Patients: SugarRoll.be Fact Sheet for Healthcare Providers: https://www.woods-mathews.com/ This test is not yet approved or cleared by the [place] FDA and  has been authorized for detection and/or diagnosis of SARS-CoV-2 by FDA under an Emergency Use Authorization (EUA). This EUA will remain  in effect (meaning this test can be used) for the duration of the COVID-19 declaration under Section 56 4(b)(1) of the Act, 21 U.S.C. section 360bbb-3(b)(1), unless the authorization is terminated or revoked sooner. Performed at [facility], [place] [address], [place], [place] [address]   Respiratory Panel by PCR     Status: None   Collection Time: 02/22/19  2:50 AM   Specimen: Nasopharyngeal Swab; Respiratory  Result Value Ref Range Status   Adenovirus NOT DETECTED NOT DETECTED Final   Coronavirus  229E NOT DETECTED NOT DETECTED Final    Comment: (NOTE) The Coronavirus on the Respiratory Panel, DOES NOT test for the novel  Coronavirus (2019 nCoV)    Coronavirus HKU1 NOT DETECTED NOT DETECTED Final   Coronavirus NL63 NOT DETECTED NOT DETECTED Final   Coronavirus OC43 NOT DETECTED NOT DETECTED Final   Metapneumovirus NOT DETECTED NOT DETECTED Final   Rhinovirus / Enterovirus NOT DETECTED NOT DETECTED Final   Influenza A NOT DETECTED NOT DETECTED Final   Influenza B NOT DETECTED NOT DETECTED Final   Parainfluenza Virus 1 NOT DETECTED NOT DETECTED Final   Parainfluenza Virus 2 NOT DETECTED NOT DETECTED Final   Parainfluenza Virus 3 NOT DETECTED NOT DETECTED Final   Parainfluenza Virus 4 NOT DETECTED NOT DETECTED Final   Respiratory Syncytial Virus NOT DETECTED NOT DETECTED Final   Bordetella pertussis NOT DETECTED NOT DETECTED Final   Chlamydophila pneumoniae NOT DETECTED NOT DETECTED Final   Mycoplasma pneumoniae NOT DETECTED NOT DETECTED Final    Comment: Performed at [facility], [place]. [address], [place], [place] [address]  MRSA PCR Screening     Status: None   Collection Time: 02/22/19  2:50 AM   Specimen: Nasopharyngeal  Result Value Ref Range Status   MRSA by PCR NEGATIVE NEGATIVE Final    Comment:        The GeneXpert MRSA Assay (FDA approved for NASAL specimens only), is one component of a comprehensive MRSA colonization surveillance program. It is not intended to diagnose MRSA infection nor to guide or monitor treatment for MRSA infections. Performed at [facility], [place] [address] [place], [place] [address]      Labs: BNP (last 3 results) Recent Labs    02/21/19 1816  BNP 33.3   Basic Metabolic Panel: Recent Labs  Lab 02/23/19 1605 02/24/19 0229 02/25/19 0213 02/26/19 0202 02/27/19 0400  NA 146* 143 145 145 144  K 3.7 3.9 4.6 4.1 4.7  CL 109 107 113* 114* 114*  CO2 _0 20* 21*  GLUCOSE 158* 147* 202* 195* 185*  BUN  38* 32* 27* 28* 28*  CREATININE 1.36* 1.14 1.20 1.19 1.15  CALCIUM 9.2 9.1 9.1 9.1 9.0  MG  --  2.5*  --  2.3  --    Liver Function Tests: Recent Labs  Lab 02/21/19 1816 02/22/19 1314 02/23/19 0307 02/24/19 0229  AST 39 26 27 36  ALT _0 ALKPHOS 76 68 62 63  BILITOT 1.2 1.0 1.1 1.0  PROT 8.6* 7.5 7.2 6.9  ALBUMIN 3.2* 2.9* 2.8* 2.6*   No results for input(s): LIPASE, AMYLASE in the last 168 hours. No results for input(s): AMMONIA in the last 168 hours. CBC: Recent Labs  Lab 02/21/19 1816 02/21/19 2226 02/22/19 1314 02/23/19 0307 02/24/19 0229 02/25/19 0213 02/26/19 0202  WBC 7.7   < > 8.5 11.5* 11.0* 9.5 11.5*  NEUTROABS 6.1  --   --  9.9* 9.3*  --   --   HGB 14.9   < > 13.1 13.2 13.7 14.0 13.8  HCT 46.2   < > 41.1 42.7 43.2 44.0 43.3  MCV 81.9   < > 83.5 84.9 83.6 83.7 83.3  PLT 362   < > 362 389 395 375 431*   < > = values in this interval not displayed.   Cardiac Enzymes: No results for input(s): CKTOTAL, CKMB, CKMBINDEX, TROPONINI in the last 168 hours. BNP: Invalid input(s): POCBNP CBG: Recent Labs  Lab 02/27/19 1149 02/27/19 1556 02/27/19 2041 02/28/19 0757 02/28/19 1203  GLUCAP 141* 85 99 143* 109*   D-Dimer Recent Labs    02/26/19 0202  DDIMER 3.76*   Hgb A1c No results for input(s): HGBA1C in the last 72 hours. Lipid Profile No results for input(s): CHOL, HDL, LDLCALC, TRIG, CHOLHDL, LDLDIRECT in the last 72 hours. Thyroid function studies No results for input(s): TSH, T4TOTAL, T3FREE, THYROIDAB in the last 72 hours.  Invalid input(s): FREET3 Anemia work up Recent Labs    02/26/19 0202  FERRITIN 1,175*   Urinalysis    Component Value Date/Time   COLORURINE YELLOW 02/22/2019 [place] 02/22/2019 1247   LABSPEC 1.033 (H) 02/22/2019 1247   PHURINE 5.0 02/22/2019 1247   GLUCOSEU 50 (A) 02/22/2019 1247   HGBUR SMALL (A) 02/22/2019 1247   BILIRUBINUR NEGATIVE 02/22/2019 1247   KETONESUR NEGATIVE 02/22/2019  1247   PROTEINUR 30 (A) 02/22/2019 1247   NITRITE NEGATIVE 02/22/2019 1247   LEUKOCYTESUR NEGATIVE 02/22/2019 1247   Sepsis Labs Invalid input(s): PROCALCITONIN,  WBC,  LACTICIDVEN Microbiology Recent Results (from the past 240 hour(s))  Blood culture (routine x 2)     Status: None   Collection Time: 02/21/19  6:10 PM   Specimen: BLOOD  Result Value Ref Range Status   Specimen Description   Final    BLOOD LEFT ANTECUBITAL Performed at [facility], [address] [place], [place] [address]    Special Requests   Final    BOTTLES DRAWN AEROBIC AND ANAEROBIC Blood Culture results may not be optimal due to an inadequate volume of blood received in culture bottles   Culture   Final    NO GROWTH 5 DAYS Performed at [facility], [place] [address], [place], [place] [address]    Report Status 02/26/2019 FINAL  Final  Blood culture (routine x 2)     Status: None   Collection Time: 02/21/19  6:15 PM   Specimen: BLOOD  Result Value Ref Range Status   Specimen Description   Final  BLOOD LEFT ANTECUBITAL Performed at [facility], [address] [place], [place] [address]    Special Requests   Final    BOTTLES DRAWN AEROBIC AND ANAEROBIC Blood Culture adequate volume Performed at [facility], [address] [place], [place] [address]    Culture   Final    NO GROWTH 5 DAYS Performed at [facility], [place] [address], [place], [place] [address]    Report Status 02/26/2019 FINAL  Final  SARS Coronavirus 2 Ag (30 min TAT) - Nasal Swab (BD Veritor Kit)     Status: None   Collection Time: 02/21/19  6:16 PM   Specimen: Nasal Swab (BD Veritor Kit)  Result Value Ref Range Status   SARS Coronavirus 2 Ag NEGATIVE NEGATIVE Final    Comment: (NOTE) SARS-CoV-2 antigen NOT DETECTED.  Negative results are presumptive.  Negative results do not preclude SARS-CoV-2 infection and should not be used as the sole basis for treatment or other patient  management decisions, including infection  control decisions, particularly in the presence of clinical signs and  symptoms consistent with COVID-19, or in those who have been in contact with the virus.  Negative results must be combined with clinical observations, patient history, and epidemiological information. The expected result is Negative. Fact Sheet for Patients: PodPark.tn Fact Sheet for Healthcare Providers: GiftContent.is This test is not yet approved or cleared by the [place] FDA and  has been authorized for detection and/or diagnosis of SARS-CoV-2 by FDA under an Emergency Use Authorization (EUA).  This EUA will remain in effect (meaning this test can be used) for the duration of  the COVID-19 de claration under Section 564(b)(1) of the Act, 21 U.S.C. section 360bbb-3(b)(1), unless the authorization is terminated or revoked sooner. Performed at [facility], [address] [place], [place] [address]   SARS Coronavirus 2 by RT PCR (hospital order, performed in [facility] hospital lab) Nasopharyngeal Nasopharyngeal Swab     Status: None   Collection Time: 02/21/19  7:07 PM   Specimen: Nasopharyngeal Swab  Result Value Ref Range Status   SARS Coronavirus 2 NEGATIVE NEGATIVE Final    Comment: Performed at [facility], [address] [place], [place] [address]  SARS CORONAVIRUS 2 (TAT 6-24 HRS) Nasopharyngeal Nasopharyngeal Swab     Status: None   Collection Time: 02/22/19  2:50 AM   Specimen: Nasopharyngeal Swab  Result Value Ref Range Status   SARS Coronavirus 2 NEGATIVE NEGATIVE Final    Comment: (NOTE) SARS-CoV-2 target nucleic acids are NOT DETECTED. The SARS-CoV-2 RNA is generally detectable in upper and lower respiratory specimens during the acute phase of infection. Negative results do not preclude SARS-CoV-2 infection, do not rule out co-infections with other pathogens, and  should not be used as the sole basis for treatment or other patient management decisions. Negative results must be combined with clinical observations, patient history, and epidemiological information. The expected result is Negative. Fact Sheet for Patients: SugarRoll.be Fact Sheet for Healthcare Providers: https://www.woods-mathews.com/ This test is not yet approved or cleared by the [place] FDA and  has been authorized for detection and/or diagnosis of SARS-CoV-2 by FDA under an Emergency Use Authorization (EUA). This EUA will remain  in effect (meaning this test can be used) for the duration of the COVID-19 declaration under Section 56 4(b)(1) of the Act, 21 U.S.C. section 360bbb-3(b)(1), unless the authorization is terminated or revoked sooner. Performed at [facility] [place]  [facility], [place] [address] [place], [place] [address]   Respiratory Panel by PCR     Status: None   Collection Time: 02/22/19  2:50 AM   Specimen: Nasopharyngeal Swab; Respiratory  Result Value Ref Range Status   Adenovirus NOT DETECTED NOT DETECTED Final   Coronavirus 229E NOT DETECTED NOT DETECTED Final    Comment: (NOTE) The Coronavirus on the Respiratory Panel, DOES NOT test for the novel  Coronavirus (2019 nCoV)    Coronavirus HKU1 NOT DETECTED NOT DETECTED Final   Coronavirus NL63 NOT DETECTED NOT DETECTED Final   Coronavirus OC43 NOT DETECTED NOT DETECTED Final   Metapneumovirus NOT DETECTED NOT DETECTED Final   Rhinovirus / Enterovirus NOT DETECTED NOT DETECTED Final   Influenza A NOT DETECTED NOT DETECTED Final   Influenza B NOT DETECTED NOT DETECTED Final   Parainfluenza Virus 1 NOT DETECTED NOT DETECTED Final   Parainfluenza Virus 2 NOT DETECTED NOT DETECTED Final   Parainfluenza Virus 3 NOT DETECTED NOT DETECTED Final   Parainfluenza Virus 4 NOT DETECTED NOT DETECTED Final   Respiratory Syncytial Virus NOT DETECTED NOT DETECTED Final   Bordetella  pertussis NOT DETECTED NOT DETECTED Final   Chlamydophila pneumoniae NOT DETECTED NOT DETECTED Final   Mycoplasma pneumoniae NOT DETECTED NOT DETECTED Final    Comment: Performed at [facility], [place]. [address], [place], [place] [address]  MRSA PCR Screening     Status: None   Collection Time: 02/22/19  2:50 AM   Specimen: Nasopharyngeal  Result Value Ref Range Status   MRSA by PCR NEGATIVE NEGATIVE Final    Comment:        The GeneXpert MRSA Assay (FDA approved for NASAL specimens only), is one component of a comprehensive MRSA colonization surveillance program. It is not intended to diagnose MRSA infection nor to guide or monitor treatment for MRSA infections. Performed at [facility], [place] [address] [place], [place] [address]     Discharge Instructions     Discharge Instructions    Diet - low sodium heart healthy   Complete by: As directed    Discharge instructions   Complete by: As directed    You were seen and examined in the hospital for suspected COVID-19 and cared for by a hospitalist.   Upon Discharge:  -Stop taking losartan, naproxen, hydrochlorothiazide Make an appointment with your primary care physician within 7 days Get lab work prior to your follow up appointment with your PCP Bring all home medications to your appointment to review Request that your primary physician go over all hospital tests and procedures/radiological results at the follow up.   Please get all hospital records sent to your physician by signing a hospital release before you go home.     Read the complete instructions along with all the possible side effects for all the medicines you take and that have been prescribed to you. Take any new medicines after you have completely understood and accept all the possible adverse reactions/side effects.   If you have any questions about your discharge medications or the care you received while you were in the hospital,  you can call the unit and asked to speak with the hospitalist on call. Once you are discharged, your primary care physician will handle any further medical issues. Please note that NO REFILLS for any discharge medications will be authorized, as it is imperative that you return to your primary care physician (or establish a relationship with a primary  care physician if you do not have one) for your aftercare needs so that they can reassess your need for medications and monitor your lab values.   Do not drive, operate heavy machinery, perform activities at heights, swimming or participation in water activities or provide baby sitting services if your were admitted for loss of consciousness/seizures or if you are on sedating medications including, but not limited to benzodiazepines, sleep medications, narcotic pain medications, etc., until you have been cleared to do so by a medical doctor.   Do not take more than prescribed medications.   Wear a seat belt while driving.  If you have smoked or chewed Tobacco in the last 2 years please stop smoking; also stop any regular Alcohol and/or any Recreational drug use including marijuana.  If you experience worsening of your admission symptoms or develop shortness of breath, chest pain, suicidal or homicidal thoughts or experience a life threatening emergency, you must seek medical attention immediately by calling 911 or calling your PCP immediately.   Increase activity slowly   Complete by: As directed      Allergies as of 02/28/2019      Reactions   Lisinopril Cough      Medication List    STOP taking these medications   hydrochlorothiazide 25 MG tablet Commonly known as: HYDRODIURIL   losartan 50 MG tablet Commonly known as: COZAAR   naproxen 500 MG tablet Commonly known as: NAPROSYN     TAKE these medications   aspirin 81 MG chewable tablet Chew 81 mg by mouth daily.   atenolol 25 MG tablet Commonly known as: TENORMIN Take 25 mg by  mouth daily.   atorvastatin 80 MG tablet Commonly known as: LIPITOR Take 80 mg by mouth daily.   fluticasone 50 MCG/ACT nasal spray Commonly known as: FLONASE Place 1 spray into both nostrils daily as needed for allergies or rhinitis.   KRILL OIL PO Take 1 capsule by mouth daily.   latanoprost 0.005 % ophthalmic solution Commonly known as: XALATAN Place 1 drop into both eyes at bedtime.   metFORMIN 500 MG tablet Commonly known as: GLUCOPHAGE Take 500 mg by mouth daily.   tamsulosin 0.4 MG Caps capsule Commonly known as: FLOMAX Take 0.4 mg by mouth daily.   timolol 0.5 % ophthalmic solution Commonly known as: TIMOPTIC Place 1 drop into both eyes 2 (two) times daily.            Durable Medical Equipment  (From admission, onward)         Start     Ordered   02/28/19 1511  DME Walker  Once    Question Answer Comment  Walker: With 5 Inch Wheels   Patient needs a walker to treat with the following condition Ambulatory dysfunction      02/28/19 1510          Allergies  Allergen Reactions  . Lisinopril Cough    Time coordinating discharge: Over 30 minutes   SIGNED:   Harold Hedge, D.O. [facility] Pager: [phone]  02/28/2019, 3:18 PM

## 2019-02-26 NOTE — Progress Notes (Signed)
PROGRESS NOTE    Jerry Caldwell.    Code Status: Full Code  ONG:[id number] DOB: 1945-06-26 DOA: 02/21/2019  PCP: Haywood Pao, MD    Hospital Summary  74 y.o.male,w hypertension, hyperlipidemia, Dm2, Bph, OSA presents with dyspnea x 3 days with hypoxia in ED. Despite symptoms and lab values consistent with COVID-19, patient has had negative respiratory panel as well as COVID-19 negative so far and has been on treatment for CAP as well as COVID-19 with remdesivir and steroids. Significantly elevated initial D-dimer > 20, started on full dose lovenox.  Negative CTA chest for PE, negative bilateral lower extremity Doppler for DVT. D Dimer decreased and lovenox changed to intermediate dosing   A & P   Principal Problem:   Acute respiratory failure with hypoxia (HCC) Active Problems:   Suspected COVID-19 virus infection   Pneumonia  Sepsis with Acute respiratory failure with hypoxia secondary to CAP and likelycovid-19 Sepsis on admission with elevated lactate, hypoxia, tachycardia presumed COVID 19/CAP. Sepsis as resolved. Tolerating room air at rest, not on ambulation Keep pulse ox on earlobe, inaccurate on finger -Blood culture x2no growth to date -Urine strepand legionellaantigens negative -MRSA swab neg, thus vanocmycin discontinued -Completed 5 days Rocephin/azithromycin  Presumed covid-19 infection -COVID antigen neg, COVID RNA negative -Positive COVID-19 contact in wife -CRP elevated at 9.8 on admission, elevated ferritin, neg procal -Treated empirically for COVID 19 with 5 days remdesivir and decadron, discharged without steroids as he tolerated RA. -Ddimer >20->5.86->3.5. CTA chest reviewed, neg for PE but does confirm marked diffuse B infiltrates. -Will likely be discharged on Aspirin 162 mg x 30 days then resume normal dose -Incentive spirometry  Hypernatremia - resolved with D5W  ARF, unknown baseline -Likely from dehydration, resolved with  D5W -Renal US reviewed. Findings of medical renal disease  Dementia -seems to be near baseline but may be developing hospital acquired delirium versus from high flow oxygen -Delirium precautions  Dm2 -treated with Tradjenta and sliding scale -discharged on home regimen  Hypertension -Cont Atenolol 75m po qdayas tolerated -HoldingHydrochlorothiazide 228mpo qdaygiven concerns of dehydration -HoldingLosartan 505mo qday given ARF  Hyperlipidemia -Cont Lipitor 33m21m qhs  Bph -Cont Flomaxas tolerated  Glaucoma -Cont TimopticandXalatanas tolerated  DVT prophylaxis: Lovenox per pharmacy Family Communication: Patient's daughter has been updated by phone 2/5 Disposition Plan: Patient medically stable for discharge.  PT recommending OT and SNF at discharge.  TOC team consulted.  Transfer to [facility] airborne precautions  Consultants  None  Procedures  None  Antibiotics   Anti-infectives (From admission, onward)   Start     Dose/Rate Route Frequency Ordered Stop   02/23/19 1000  remdesivir 100 mg in sodium chloride 0.9 % 100 mL IVPB     100 mg 200 mL/hr over 30 Minutes Intravenous Daily 02/22/19 0721 02/26/19 1019   02/23/19 1000  azithromycin (ZITHROMAX) tablet 500 mg  Status:  Discontinued     500 mg Oral Daily 02/23/19 0725 02/25/19 1346   02/22/19 1000  remdesivir 200 mg in sodium chloride 0.9% 250 mL IVPB     200 mg 580 mL/hr over 30 Minutes Intravenous Once 02/22/19 0721 02/22/19 1900   02/22/19 0800  vancomycin (VANCOREADY) IVPB 1750 mg/350 mL  Status:  Discontinued     1,750 mg 175 mL/hr over 120 Minutes Intravenous Every 24 hours 02/22/19 0311 02/22/19 0733   02/22/19 0400  cefTRIAXone (ROCEPHIN) 2 g in sodium chloride 0.9 % 100 mL IVPB  Status:  Discontinued  2 g 200 mL/hr over 30 Minutes Intravenous Daily 02/22/19 0256 02/25/19 1346   02/22/19 0300  azithromycin (ZITHROMAX) 500 mg in sodium chloride 0.9 % 250 mL IVPB  Status:   Discontinued     500 mg 250 mL/hr over 60 Minutes Intravenous Daily 02/22/19 0256 02/23/19 0725   02/21/19 2130  vancomycin (VANCOCIN) IVPB 1000 mg/200 mL premix     1,000 mg 200 mL/hr over 60 Minutes Intravenous  Once 02/21/19 2118 02/22/19 0007   02/21/19 2130  piperacillin-tazobactam (ZOSYN) IVPB 3.375 g     3.375 g 12.5 mL/hr over 240 Minutes Intravenous  Once 02/21/19 2118 02/21/19 2300           Subjective   Patient resting comfortably lying flat on room air.  Bedside nurse states patient has SPO2 in the mid to high 80s when pulse ox on finger however improved to mid to high 90s when pulse ox moved to earlobe.  Patient denies any complaints at this time.  Objective   Vitals:   02/26/19 0800 02/26/19 0900 02/26/19 0914 02/26/19 1246  BP: 120/78 (!) 151/82    Pulse:   92   Resp:   19   Temp: 97.7 F (36.5 C)   97.6 F (36.4 C)  TempSrc: Axillary   Axillary  SpO2:   (!) 89%   Weight:      Height:        Intake/Output Summary (Last 24 hours) at 02/26/2019 1531 Last data filed at 02/26/2019 0400 Gross per 24 hour  Intake 80 ml  Output 1250 ml  Net -1170 ml   Filed Weights   02/24/19 0500 02/25/19 0628 02/26/19 0452  Weight: 79.5 kg 78.6 kg 78.5 kg    Examination:  Physical Exam Vitals and nursing note reviewed.  Constitutional:      Appearance: Normal appearance.  HENT:     Head: Normocephalic and atraumatic.  Eyes:     Extraocular Movements: Extraocular movements intact.  Cardiovascular:     Rate and Rhythm: Normal rate and regular rhythm.  Pulmonary:     Effort: Pulmonary effort is normal. No respiratory distress.  Abdominal:     General: Abdomen is flat.     Palpations: Abdomen is soft.  Musculoskeletal:        General: No swelling or tenderness.  Neurological:     Mental Status: He is alert.     Comments: Seems pleasantly confused today, may be baseline  Psychiatric:        Mood and Affect: Mood normal.        Behavior: Behavior normal.      Data Reviewed: I have personally reviewed following labs and imaging studies  CBC: Recent Labs  Lab 02/21/19 1816 02/21/19 2226 02/22/19 1314 02/23/19 0307 02/24/19 0229 02/25/19 0213 02/26/19 0202  WBC 7.7   < > 8.5 11.5* 11.0* 9.5 11.5*  NEUTROABS 6.1  --   --  9.9* 9.3*  --   --   HGB 14.9   < > 13.1 13.2 13.7 14.0 13.8  HCT 46.2   < > 41.1 42.7 43.2 44.0 43.3  MCV 81.9   < > 83.5 84.9 83.6 83.7 83.3  PLT 362   < > 362 389 395 375 431*   < > = values in this interval not displayed.   Basic Metabolic Panel: Recent Labs  Lab 02/23/19 0307 02/23/19 1605 02/24/19 0229 02/25/19 0213 02/26/19 0202  NA 148* 146* 143 145 145  K 4.3 3.7 3.9 4.6  4.1  CL 113* 109 107 113* 114*  CO2 _0 20*  GLUCOSE 168* 158* 147* 202* 195*  BUN 39* 38* 32* 27* 28*  CREATININE 1.51* 1.36* 1.14 1.20 1.19  CALCIUM 9.1 9.2 9.1 9.1 9.1  MG  --   --  2.5*  --  2.3   GFR: Estimated Creatinine Clearance: 63.2 mL/min (by C-G formula based on SCr of 1.19 mg/dL). Liver Function Tests: Recent Labs  Lab 02/21/19 1816 02/22/19 1314 02/23/19 0307 02/24/19 0229  AST 39 26 27 36  ALT _1 ALKPHOS 76 68 62 63  BILITOT 1.2 1.0 1.1 1.0  PROT 8.6* 7.5 7.2 6.9  ALBUMIN 3.2* 2.9* 2.8* 2.6*   No results for input(s): LIPASE, AMYLASE in the last 168 hours. No results for input(s): AMMONIA in the last 168 hours. Coagulation Profile: No results for input(s): INR, PROTIME in the last 168 hours. Cardiac Enzymes: No results for input(s): CKTOTAL, CKMB, CKMBINDEX, TROPONINI in the last 168 hours. BNP (last 3 results) No results for input(s): PROBNP in the last 8760 hours. HbA1C: No results for input(s): HGBA1C in the last 72 hours. CBG: Recent Labs  Lab 02/24/19 1215 02/24/19 1630 02/24/19 2130 02/26/19 0905 02/26/19 1116  GLUCAP 137* 120* 141* 169* 137*   Lipid Profile: No results for input(s): CHOL, HDL, LDLCALC, TRIG, CHOLHDL, LDLDIRECT in the last 72 hours. Thyroid  Function Tests: No results for input(s): TSH, T4TOTAL, FREET4, T3FREE, THYROIDAB in the last 72 hours. Anemia Panel: Recent Labs    02/25/19 0213 02/26/19 0202  FERRITIN 1,306* 1,175*   Sepsis Labs: Recent Labs  Lab 02/21/19 1816 02/21/19 2348 02/22/19 0310  PROCALCITON <0.10  --   --   LATICACIDVEN 3.0* 1.5 1.9    Recent Results (from the past 240 hour(s))  Blood culture (routine x 2)     Status: None   Collection Time: 02/21/19  6:10 PM   Specimen: BLOOD  Result Value Ref Range Status   Specimen Description   Final    BLOOD LEFT ANTECUBITAL Performed at [facility], [address] [place], [place] [address]    Special Requests   Final    BOTTLES DRAWN AEROBIC AND ANAEROBIC Blood Culture results may not be optimal due to an inadequate volume of blood received in culture bottles   Culture   Final    NO GROWTH 5 DAYS Performed at [facility], [place] [address] [place], [place] [address]    Report Status 02/26/2019 FINAL  Final  Blood culture (routine x 2)     Status: None   Collection Time: 02/21/19  6:15 PM   Specimen: BLOOD  Result Value Ref Range Status   Specimen Description   Final    BLOOD LEFT ANTECUBITAL Performed at [facility], [address] [place], [place] [address]    Special Requests   Final    BOTTLES DRAWN AEROBIC AND ANAEROBIC Blood Culture adequate volume Performed at [facility], [address] [place], [place] [address]    Culture   Final    NO GROWTH 5 DAYS Performed at [facility], [place] [address], [place], [place] [address]    Report Status 02/26/2019 FINAL  Final  SARS Coronavirus 2 Ag (30 min TAT) - Nasal Swab (BD Veritor Kit)     Status: None   Collection Time: 02/21/19  6:16 PM   Specimen: Nasal Swab (BD Veritor  Kit)  Result Value Ref Range Status   SARS Coronavirus 2 Ag NEGATIVE NEGATIVE Final    Comment: (NOTE) SARS-CoV-2 antigen NOT DETECTED.  Negative results are presumptive.   Negative results do not preclude SARS-CoV-2 infection and should not be used as the sole basis for treatment or other patient management decisions, including infection  control decisions, particularly in the presence of clinical signs and  symptoms consistent with COVID-19, or in those who have been in contact with the virus.  Negative results must be combined with clinical observations, patient history, and epidemiological information. The expected result is Negative. Fact Sheet for Patients: PodPark.tn Fact Sheet for Healthcare Providers: GiftContent.is This test is not yet approved or cleared by the [place] FDA and  has been authorized for detection and/or diagnosis of SARS-CoV-2 by FDA under an Emergency Use Authorization (EUA).  This EUA will remain in effect (meaning this test can be used) for the duration of  the COVID-19 de claration under Section 564(b)(1) of the Act, 21 U.S.C. section 360bbb-3(b)(1), unless the authorization is terminated or revoked sooner. Performed at [facility], [address] [place], [place] [address]   SARS Coronavirus 2 by RT PCR (hospital order, performed in [facility] hospital lab) Nasopharyngeal Nasopharyngeal Swab     Status: None   Collection Time: 02/21/19  7:07 PM   Specimen: Nasopharyngeal Swab  Result Value Ref Range Status   SARS Coronavirus 2 NEGATIVE NEGATIVE Final    Comment: Performed at [facility], [address] [place], [place] [address]  SARS CORONAVIRUS 2 (TAT 6-24 HRS) Nasopharyngeal Nasopharyngeal Swab     Status: None   Collection Time: 02/22/19  2:50 AM   Specimen: Nasopharyngeal Swab  Result Value Ref Range Status   SARS Coronavirus 2 NEGATIVE NEGATIVE Final    Comment: (NOTE) SARS-CoV-2 target nucleic acids are NOT DETECTED. The SARS-CoV-2 RNA is generally detectable in upper and lower respiratory specimens during the acute phase of  infection. Negative results do not preclude SARS-CoV-2 infection, do not rule out co-infections with other pathogens, and should not be used as the sole basis for treatment or other patient management decisions. Negative results must be combined with clinical observations, patient history, and epidemiological information. The expected result is Negative. Fact Sheet for Patients: SugarRoll.be Fact Sheet for Healthcare Providers: https://www.woods-mathews.com/ This test is not yet approved or cleared by the [place] FDA and  has been authorized for detection and/or diagnosis of SARS-CoV-2 by FDA under an Emergency Use Authorization (EUA). This EUA will remain  in effect (meaning this test can be used) for the duration of the COVID-19 declaration under Section 56 4(b)(1) of the Act, 21 U.S.C. section 360bbb-3(b)(1), unless the authorization is terminated or revoked sooner. Performed at [facility], [place] [address], [place], [place] [address]   Respiratory Panel by PCR     Status: None   Collection Time: 02/22/19  2:50 AM   Specimen: Nasopharyngeal Swab; Respiratory  Result Value Ref Range Status   Adenovirus NOT DETECTED NOT DETECTED Final   Coronavirus 229E NOT DETECTED NOT DETECTED Final    Comment: (NOTE) The Coronavirus on the Respiratory Panel, DOES NOT test for the novel  Coronavirus (2019 nCoV)    Coronavirus HKU1 NOT DETECTED NOT DETECTED Final   Coronavirus NL63 NOT DETECTED NOT DETECTED Final   Coronavirus OC43 NOT DETECTED NOT DETECTED Final   Metapneumovirus NOT DETECTED NOT DETECTED Final   Rhinovirus / Enterovirus NOT  DETECTED NOT DETECTED Final   Influenza A NOT DETECTED NOT DETECTED Final   Influenza B NOT DETECTED NOT DETECTED Final   Parainfluenza Virus 1 NOT DETECTED NOT DETECTED Final   Parainfluenza Virus 2 NOT DETECTED NOT DETECTED Final   Parainfluenza Virus 3 NOT DETECTED NOT DETECTED Final    Parainfluenza Virus 4 NOT DETECTED NOT DETECTED Final   Respiratory Syncytial Virus NOT DETECTED NOT DETECTED Final   Bordetella pertussis NOT DETECTED NOT DETECTED Final   Chlamydophila pneumoniae NOT DETECTED NOT DETECTED Final   Mycoplasma pneumoniae NOT DETECTED NOT DETECTED Final    Comment: Performed at [facility], [place] [address] [place], [place] [address]  MRSA PCR Screening     Status: None   Collection Time: 02/22/19  2:50 AM   Specimen: Nasopharyngeal  Result Value Ref Range Status   MRSA by PCR NEGATIVE NEGATIVE Final    Comment:        The GeneXpert MRSA Assay (FDA approved for NASAL specimens only), is one component of a comprehensive MRSA colonization surveillance program. It is not intended to diagnose MRSA infection nor to guide or monitor treatment for MRSA infections. Performed at [facility], [place] [address], [place], [place] [address]          Radiology Studies: No results found.      Scheduled Meds: . aspirin  81 mg Oral Daily  . atenolol  25 mg Oral Daily  . atorvastatin  80 mg Oral Daily  . chlorhexidine  15 mL Mouth Rinse BID  . Chlorhexidine Gluconate Cloth  6 each Topical Daily  . dexamethasone (DECADRON) injection  6 mg Intravenous Q24H  . enoxaparin (LOVENOX) injection  40 mg Subcutaneous Q12H  . insulin aspart  0-9 Units Subcutaneous TID WC  . latanoprost  1 drop Both Eyes QHS  . linagliptin  5 mg Oral Daily  . mouth rinse  15 mL Mouth Rinse q12n4p  . tamsulosin  0.4 mg Oral Daily  . timolol  1 drop Both Eyes BID   Continuous Infusions:    LOS: 5 days    Time spent: 26 minutes with over 50% of the time coordinating the patient's care    Harold Hedge, DO [facility] Pager [phone]  If 7PM-7AM, please contact night-coverage [email or web address] Password [email or web address] 02/26/2019, 3:31 PM

## 2019-02-26 NOTE — TOC Progression Note (Signed)
Transition of Care (TOC) - Progression Note    Patient Details  Name: Jerry Caldwell. MRN: [id number] Date of Birth: 08-19-1945  [facility] of [facility] CM/SW Contact  Joaquin Courts, RN Phone Number: 02/26/2019, 12:51 PM  Clinical Narrative:    CM noted TOC consult for safe disposition planning. Patient previously from home where he lived with his wife who has passed from [place].  CM will await PT evaluation to determine level of care needed and assist with discharge coordination as appropriate.          Expected Discharge Plan and Services                                                 Social Determinants of Health (SDOH) Interventions    Readmission Risk Interventions No flowsheet data found.

## 2019-02-27 LAB — BASIC METABOLIC PANEL
Anion gap: 9 (ref 5–15)
BUN: 28 mg/dL — ABNORMAL HIGH (ref 8–23)
CO2: 21 mmol/L — ABNORMAL LOW (ref 22–32)
Calcium: 9 mg/dL (ref 8.9–10.3)
Chloride: 114 mmol/L — ABNORMAL HIGH (ref 98–111)
Creatinine, Ser: 1.15 mg/dL (ref 0.61–1.24)
GFR calc Af Amer: >60 mL/min (ref >60)
GFR calc non Af Amer: >60 mL/min (ref >60)
Glucose, Bld: 185 mg/dL — ABNORMAL HIGH (ref 70–99)
Potassium: 4.7 mmol/L (ref 3.5–5.1)
Sodium: 144 mmol/L (ref 135–145)

## 2019-02-27 LAB — GLUCOSE, CAPILLARY
Glucose-Capillary: 187 mg/dL — ABNORMAL HIGH (ref 70–99)
Glucose-Capillary: 151 mg/dL — ABNORMAL HIGH (ref 70–99)
Glucose-Capillary: 127 mg/dL — ABNORMAL HIGH (ref 70–99)
Glucose-Capillary: 120 mg/dL — ABNORMAL HIGH (ref 70–99)
Glucose-Capillary: 182 mg/dL — ABNORMAL HIGH (ref 70–99)
Glucose-Capillary: 141 mg/dL — ABNORMAL HIGH (ref 70–99)
Glucose-Capillary: 85 mg/dL (ref 70–99)
Glucose-Capillary: 99 mg/dL (ref 70–99)

## 2019-02-27 MED ORDER — ENOXAPARIN SODIUM 40 MG/0.4ML ~~LOC~~ SOLN
40.0000 mg | SUBCUTANEOUS | Status: DC
  Administered 2019-02-27: 40 mg via SUBCUTANEOUS
  Filled 2019-02-27: qty 0.4

## 2019-02-27 NOTE — Progress Notes (Signed)
Called Cornelius Moras, daughter, with updates and discharge plan.

## 2019-02-27 NOTE — Progress Notes (Signed)
ANTICOAGULATION CONSULT NOTE - Follow Up Consult  Pharmacy Consult for lovenox Indication: VTE prophylaxis  Allergies  Allergen Reactions  . Lisinopril Cough    Patient Measurements: Height: 6\' 1"  (185.4 cm) Weight: 173 lb 1 oz (78.5 kg) IBW/kg (Calculated) : 79.9 Heparin Dosing Weight:   Vital Signs: Temp: 97.6 F (36.4 C) (02/07 0558) Temp Source: Oral (02/07 0558) BP: 133/91 (02/07 0558) Pulse Rate: 79 (02/07 0558)  Labs: Recent Labs    02/25/19 0213 02/26/19 0202 02/27/19 0400  HGB 14.0 13.8  --   HCT 44.0 43.3  --   PLT 375 431*  --   CREATININE 1.20 1.19 1.15    Estimated Creatinine Clearance: 65.4 mL/min (by C-G formula based on SCr of 1.15 mg/dL).   Assessment: Patient's a 74 y.o M presented to the ED on 2/1 with c/o SOB and weakness. He was treated for presumed COVID+. Chest CTA nd LE doppler were negative for VTE.  He's currently on lovenox for VTE prophylaxis.  Today, 02/27/2019: - cbc stable, no bleeding documented - scr 1.15 (crcl 65) - Ddimer 3.76 on 2/6    Plan:  - Discussed with Dr. Neysa Bonito, with Ddimer <5 and pt transferred out of ICU, will adjust lovenox dose to 40 mg SQ daily.   Aoki Wedemeyer P 02/27/2019,10:56 AM

## 2019-02-27 NOTE — Progress Notes (Addendum)
PROGRESS NOTE    Jerry [place].    Code Status: Full Code  JYN:[id number] DOB: 10-Jan-1946 DOA: 02/21/2019  PCP: Haywood Pao, MD    Hospital Summary  74 y.o.male,w hypertension, hyperlipidemia, Dm2, Bph, OSA presents with dyspnea x 3 days with hypoxia in ED. Despite symptoms and lab values consistent with COVID-19, patient has had negative respiratory panel as well as COVID-19 negative so far and has been on treatment for CAP as well as COVID-19 with remdesivir and steroids. Significantly elevated initial D-dimer > 20, started on full dose lovenox.  Negative CTA chest for PE, negative bilateral lower extremity Doppler for DVT. D Dimer decreased and lovenox changed to intermediate dosing  Transferred to MedSurg 2/6  A & P   Principal Problem:   Acute respiratory failure with hypoxia (HCC) Active Problems:   Suspected COVID-19 virus infection   Pneumonia  Sepsis with Acute respiratory failure with hypoxia secondary to CAP and likelycovid-19 Sepsis on admission with elevated lactate, hypoxia, tachycardia presumed COVID 19/CAP. Sepsis as resolved. Tolerating room air at rest, not on ambulation Keep pulse ox on earlobe, inaccurate on finger -Blood culture x2no growth to date -Urine strepand legionellaantigens negative -MRSA swab neg, thus vanocmycin discontinued -Completed 5 days Rocephin/azithromycin  Presumed covid-19 infection -COVID antigen neg, COVID RNA negative -Positive COVID-19 contact in wife -CRP elevated at 9.8 on admission, elevated ferritin, neg procal -Treat empirically for COVID 19 with 5 days remdesivir and continuing decadron 10 days leukocytosis probably steroid-induced -Ddimer >20->5.86->3.5. CTA chest reviewed, neg for PE but does confirm marked diffuse B infiltrates. -Will likely be discharged on Aspirin 162 mg x 30 days then resume normal dose -Incentive spirometry  Hypernatremia - resolved with D5W  ARF, unknown baseline -Likely from  dehydration, resolved with D5W -Renal US reviewed. Findings of medical renal disease  Dementia -seems to be near baseline today -Delirium precautions  Dm2 -treated with Tradjenta and sliding scale -discharge on home regimen  Hypertension -Cont Atenolol 27m po qdayas tolerated -Stable off HCTZ and losartan which were held due to AKI  Hyperlipidemia -Cont Lipitor 829mpo qhs  Bph -Cont Flomaxas tolerated  Glaucoma -Cont TimopticandXalatanas tolerated  DVT prophylaxis: Lovenox per pharmacy Family Communication: Patient's daughter has been updated by phone 2/5 Disposition Plan: Patient medically stable for discharge.  PT recommending OT and SNF at discharge.   Consultants  None  Procedures  None  Antibiotics   Anti-infectives (From admission, onward)   Start     Dose/Rate Route Frequency Ordered Stop   02/23/19 1000  remdesivir 100 mg in sodium chloride 0.9 % 100 mL IVPB     100 mg 200 mL/hr over 30 Minutes Intravenous Daily 02/22/19 0721 02/26/19 1019   02/23/19 1000  azithromycin (ZITHROMAX) tablet 500 mg  Status:  Discontinued     500 mg Oral Daily 02/23/19 0725 02/25/19 1346   02/22/19 1000  remdesivir 200 mg in sodium chloride 0.9% 250 mL IVPB     200 mg 580 mL/hr over 30 Minutes Intravenous Once 02/22/19 0721 02/22/19 1900   02/22/19 0800  vancomycin (VANCOREADY) IVPB 1750 mg/350 mL  Status:  Discontinued     1,750 mg 175 mL/hr over 120 Minutes Intravenous Every 24 hours 02/22/19 0311 02/22/19 0733   02/22/19 0400  cefTRIAXone (ROCEPHIN) 2 g in sodium chloride 0.9 % 100 mL IVPB  Status:  Discontinued     2 g 200 mL/hr over 30 Minutes Intravenous Daily 02/22/19 0256 02/25/19 1346   02/22/19 0300  azithromycin (  ZITHROMAX) 500 mg in sodium chloride 0.9 % 250 mL IVPB  Status:  Discontinued     500 mg 250 mL/hr over 60 Minutes Intravenous Daily 02/22/19 0256 02/23/19 0725   02/21/19 2130  vancomycin (VANCOCIN) IVPB 1000 mg/200 mL premix     1,000  mg 200 mL/hr over 60 Minutes Intravenous  Once 02/21/19 2118 02/22/19 0007   02/21/19 2130  piperacillin-tazobactam (ZOSYN) IVPB 3.375 g     3.375 g 12.5 mL/hr over 240 Minutes Intravenous  Once 02/21/19 2118 02/21/19 2300           Subjective   Patient about to eat his lunch on my arrival.  Currently off of oxygen and tolerating room air well.  No complaints.  States he slept well overnight  Objective   Vitals:   02/26/19 1617 02/26/19 2102 02/27/19 0558 02/27/19 1404  BP: 136/86 119/75 (!) 133/91 117/83  Pulse: 82 79 79 72  Resp: (!) '24 20 18 18  ' Temp: (!) 97.5 F (36.4 C) 97.8 F (36.6 C) 97.6 F (36.4 C) (!) 97.4 F (36.3 C)  TempSrc: Oral Oral Oral Oral  SpO2: 98% 90% 97% 92%  Weight:      Height:        Intake/Output Summary (Last 24 hours) at 02/27/2019 1518 Last data filed at 02/27/2019 1300 Gross per 24 hour  Intake 480 ml  Output 1000 ml  Net -520 ml   Filed Weights   02/24/19 0500 02/25/19 0628 02/26/19 0452  Weight: 79.5 kg 78.6 kg 78.5 kg    Examination:  Physical Exam Vitals and nursing note reviewed.  Constitutional:      Appearance: Normal appearance.  HENT:     Head: Normocephalic and atraumatic.     Mouth/Throat:     Mouth: Mucous membranes are moist.  Eyes:     Extraocular Movements: Extraocular movements intact.  Cardiovascular:     Rate and Rhythm: Normal rate and regular rhythm.  Pulmonary:     Effort: Pulmonary effort is normal.     Breath sounds: Normal breath sounds.  Abdominal:     General: Abdomen is flat.     Palpations: Abdomen is soft.  Musculoskeletal:        General: No swelling or tenderness.  Neurological:     Mental Status: He is alert. Mental status is at baseline.  Psychiatric:        Mood and Affect: Mood normal.        Behavior: Behavior normal.     Data Reviewed: I have personally reviewed following labs and imaging studies  CBC: Recent Labs  Lab 02/21/19 1816 02/21/19 2226 02/22/19 1314  02/23/19 0307 02/24/19 0229 02/25/19 0213 02/26/19 0202  WBC 7.7   < > 8.5 11.5* 11.0* 9.5 11.5*  NEUTROABS 6.1  --   --  9.9* 9.3*  --   --   HGB 14.9   < > 13.1 13.2 13.7 14.0 13.8  HCT 46.2   < > 41.1 42.7 43.2 44.0 43.3  MCV 81.9   < > 83.5 84.9 83.6 83.7 83.3  PLT 362   < > 362 389 395 375 431*   < > = values in this interval not displayed.   Basic Metabolic Panel: Recent Labs  Lab 02/23/19 1605 02/24/19 0229 02/25/19 0213 02/26/19 0202 02/27/19 0400  NA 146* 143 145 145 144  K 3.7 3.9 4.6 4.1 4.7  CL 109 107 113* 114* 114*  CO2 '26 24 25 ' 20* 21*  GLUCOSE  158* 147* 202* 195* 185*  BUN 38* 32* 27* 28* 28*  CREATININE 1.36* 1.14 1.20 1.19 1.15  CALCIUM 9.2 9.1 9.1 9.1 9.0  MG  --  2.5*  --  2.3  --    GFR: Estimated Creatinine Clearance: 65.4 mL/min (by C-G formula based on SCr of 1.15 mg/dL). Liver Function Tests: Recent Labs  Lab 02/21/19 1816 02/22/19 1314 02/23/19 0307 02/24/19 0229  AST 39 26 27 36  ALT '24 21 22 30  ' ALKPHOS 76 68 62 63  BILITOT 1.2 1.0 1.1 1.0  PROT 8.6* 7.5 7.2 6.9  ALBUMIN 3.2* 2.9* 2.8* 2.6*   No results for input(s): LIPASE, AMYLASE in the last 168 hours. No results for input(s): AMMONIA in the last 168 hours. Coagulation Profile: No results for input(s): INR, PROTIME in the last 168 hours. Cardiac Enzymes: No results for input(s): CKTOTAL, CKMB, CKMBINDEX, TROPONINI in the last 168 hours. BNP (last 3 results) No results for input(s): PROBNP in the last 8760 hours. HbA1C: No results for input(s): HGBA1C in the last 72 hours. CBG: Recent Labs  Lab 02/26/19 1116 02/26/19 1629 02/26/19 2057 02/27/19 0714 02/27/19 1149  GLUCAP 137* 98 109* 182* 141*   Lipid Profile: No results for input(s): CHOL, HDL, LDLCALC, TRIG, CHOLHDL, LDLDIRECT in the last 72 hours. Thyroid Function Tests: No results for input(s): TSH, T4TOTAL, FREET4, T3FREE, THYROIDAB in the last 72 hours. Anemia Panel: Recent Labs    02/25/19 0213  02/26/19 0202  FERRITIN 1,306* 1,175*   Sepsis Labs: Recent Labs  Lab 02/21/19 1816 02/21/19 2348 02/22/19 0310  PROCALCITON <0.10  --   --   LATICACIDVEN 3.0* 1.5 1.9    Recent Results (from the past 240 hour(s))  Blood culture (routine x 2)     Status: None   Collection Time: 02/21/19  6:10 PM   Specimen: BLOOD  Result Value Ref Range Status   Specimen Description   Final    BLOOD LEFT ANTECUBITAL Performed at [facility], [address] [place], [place] [address]    Special Requests   Final    BOTTLES DRAWN AEROBIC AND ANAEROBIC Blood Culture results may not be optimal due to an inadequate volume of blood received in culture bottles   Culture   Final    NO GROWTH 5 DAYS Performed at [facility], [place] [address], [place], [place] [address]    Report Status 02/26/2019 FINAL  Final  Blood culture (routine x 2)     Status: None   Collection Time: 02/21/19  6:15 PM   Specimen: BLOOD  Result Value Ref Range Status   Specimen Description   Final    BLOOD LEFT ANTECUBITAL Performed at [facility], [address] [place], [place] [address]    Special Requests   Final    BOTTLES DRAWN AEROBIC AND ANAEROBIC Blood Culture adequate volume Performed at [facility], [address] [place], [place] [address]    Culture   Final    NO GROWTH 5 DAYS Performed at [facility], [place] [address] [place], [place] [address]    Report Status 02/26/2019 FINAL  Final  SARS Coronavirus 2 Ag (30 min TAT) - Nasal Swab (BD Veritor Kit)     Status: None   Collection Time: 02/21/19  6:16 PM   Specimen: Nasal Swab (BD Veritor Kit)  Result Value Ref Range Status   SARS Coronavirus 2 Ag NEGATIVE NEGATIVE Final  Comment: (NOTE) SARS-CoV-2 antigen NOT DETECTED.  Negative results are presumptive.  Negative results do not preclude SARS-CoV-2 infection and should not be used as the sole basis for treatment or other patient management decisions,  including infection  control decisions, particularly in the presence of clinical signs and  symptoms consistent with COVID-19, or in those who have been in contact with the virus.  Negative results must be combined with clinical observations, patient history, and epidemiological information. The expected result is Negative. Fact Sheet for Patients: PodPark.tn Fact Sheet for Healthcare Providers: GiftContent.is This test is not yet approved or cleared by the [place] FDA and  has been authorized for detection and/or diagnosis of SARS-CoV-2 by FDA under an Emergency Use Authorization (EUA).  This EUA will remain in effect (meaning this test can be used) for the duration of  the COVID-19 de claration under Section 564(b)(1) of the Act, 21 U.S.C. section 360bbb-3(b)(1), unless the authorization is terminated or revoked sooner. Performed at [facility], [address] [place], [place] [address]   SARS Coronavirus 2 by RT PCR (hospital order, performed in [facility] hospital lab) Nasopharyngeal Nasopharyngeal Swab     Status: None   Collection Time: 02/21/19  7:07 PM   Specimen: Nasopharyngeal Swab  Result Value Ref Range Status   SARS Coronavirus 2 NEGATIVE NEGATIVE Final    Comment: Performed at [facility], [address] [place], [place] [address]  SARS CORONAVIRUS 2 (TAT 6-24 HRS) Nasopharyngeal Nasopharyngeal Swab     Status: None   Collection Time: 02/22/19  2:50 AM   Specimen: Nasopharyngeal Swab  Result Value Ref Range Status   SARS Coronavirus 2 NEGATIVE NEGATIVE Final    Comment: (NOTE) SARS-CoV-2 target nucleic acids are NOT DETECTED. The SARS-CoV-2 RNA is generally detectable in upper and lower respiratory specimens during the acute phase of infection. Negative results do not preclude SARS-CoV-2 infection, do not rule out co-infections with other pathogens, and should not be used as  the sole basis for treatment or other patient management decisions. Negative results must be combined with clinical observations, patient history, and epidemiological information. The expected result is Negative. Fact Sheet for Patients: SugarRoll.be Fact Sheet for Healthcare Providers: https://www.woods-mathews.com/ This test is not yet approved or cleared by the [place] FDA and  has been authorized for detection and/or diagnosis of SARS-CoV-2 by FDA under an Emergency Use Authorization (EUA). This EUA will remain  in effect (meaning this test can be used) for the duration of the COVID-19 declaration under Section 56 4(b)(1) of the Act, 21 U.S.C. section 360bbb-3(b)(1), unless the authorization is terminated or revoked sooner. Performed at [facility], [place] [address], [place], [place] [address]   Respiratory Panel by PCR     Status: None   Collection Time: 02/22/19  2:50 AM   Specimen: Nasopharyngeal Swab; Respiratory  Result Value Ref Range Status   Adenovirus NOT DETECTED NOT DETECTED Final   Coronavirus 229E NOT DETECTED NOT DETECTED Final    Comment: (NOTE) The Coronavirus on the Respiratory Panel, DOES NOT test for the novel  Coronavirus (2019 nCoV)    Coronavirus HKU1 NOT DETECTED NOT DETECTED Final   Coronavirus NL63 NOT DETECTED NOT DETECTED Final   Coronavirus OC43 NOT DETECTED NOT DETECTED Final   Metapneumovirus NOT DETECTED NOT DETECTED Final   Rhinovirus / Enterovirus NOT DETECTED NOT DETECTED Final   Influenza A NOT DETECTED NOT DETECTED Final   Influenza B NOT DETECTED  NOT DETECTED Final   Parainfluenza Virus 1 NOT DETECTED NOT DETECTED Final   Parainfluenza Virus 2 NOT DETECTED NOT DETECTED Final   Parainfluenza Virus 3 NOT DETECTED NOT DETECTED Final   Parainfluenza Virus 4 NOT DETECTED NOT DETECTED Final   Respiratory Syncytial Virus NOT DETECTED NOT DETECTED Final   Bordetella pertussis NOT DETECTED NOT  DETECTED Final   Chlamydophila pneumoniae NOT DETECTED NOT DETECTED Final   Mycoplasma pneumoniae NOT DETECTED NOT DETECTED Final    Comment: Performed at [facility], [place] [address], [place], [place] [address]  MRSA PCR Screening     Status: None   Collection Time: 02/22/19  2:50 AM   Specimen: Nasopharyngeal  Result Value Ref Range Status   MRSA by PCR NEGATIVE NEGATIVE Final    Comment:        The GeneXpert MRSA Assay (FDA approved for NASAL specimens only), is one component of a comprehensive MRSA colonization surveillance program. It is not intended to diagnose MRSA infection nor to guide or monitor treatment for MRSA infections. Performed at [facility], [place] [address] [place], [place] [address]          Radiology Studies: No results found.      Scheduled Meds: . aspirin  81 mg Oral Daily  . atenolol  25 mg Oral Daily  . atorvastatin  80 mg Oral Daily  . chlorhexidine  15 mL Mouth Rinse BID  . Chlorhexidine Gluconate Cloth  6 each Topical Daily  . dexamethasone (DECADRON) injection  6 mg Intravenous Q24H  . enoxaparin (LOVENOX) injection  40 mg Subcutaneous Q24H  . insulin aspart  0-9 Units Subcutaneous TID WC  . latanoprost  1 drop Both Eyes QHS  . linagliptin  5 mg Oral Daily  . mouth rinse  15 mL Mouth Rinse q12n4p  . tamsulosin  0.4 mg Oral Daily  . timolol  1 drop Both Eyes BID   Continuous Infusions:    LOS: 6 days    Time spent: 80mnutes with over 50% of the time coordinating the patient's care    JHarold Hedge DO [facility] Pager [phone] If 7PM-7AM, please contact night-coverage [email or web address] Password [facility]2/07/2019, 3:18 PM

## 2019-02-28 LAB — GLUCOSE, CAPILLARY
Glucose-Capillary: 143 mg/dL — ABNORMAL HIGH (ref 70–99)
Glucose-Capillary: 109 mg/dL — ABNORMAL HIGH (ref 70–99)
Glucose-Capillary: 112 mg/dL — ABNORMAL HIGH (ref 70–99)

## 2019-02-28 NOTE — TOC Progression Note (Signed)
Transition of Care (TOC) - Progression Note    Patient Details  Name: Jerry [place]. MRN: [id number] Date of Birth: March 08, 1945  Transition of Care [facility]) CM/SW Contact  Purcell Mouton, RN Phone Number: 02/28/2019, 12:18 PM  Clinical Narrative:    Was informed that pt ws going to SNF. FL2 completed and faxed to SNF's in the area.         Expected Discharge Plan and Services                                                 Social Determinants of Health (SDOH) Interventions    Readmission Risk Interventions No flowsheet data found.

## 2019-02-28 NOTE — NC FL2 (Signed)
[place] LEVEL OF CARE SCREENING TOOL     IDENTIFICATION  Patient Name: Jerry [place]. Birthdate: May 23, 1945 Sex: male Admission Date (Current Location): 02/21/2019  [facility] and [place] Number:  Herbalist and Address:  [facility],  [facility], [place]      Provider Number: [id number]  Attending Physician Name and Address:  Harold Hedge, MD  Relative Name and Phone Number:       Current Level of Care: Hospital Recommended Level of Care: [place] Prior Approval Number:    Date Approved/Denied:   PASRR Number: [id number]  Discharge Plan: SNF    Current Diagnoses: Patient Active Problem List   Diagnosis Date Noted  . Acute respiratory failure with hypoxia ([place]) 02/22/2019  . Pneumonia 02/22/2019  . Suspected COVID-19 virus infection 02/21/2019  . OSA treated with BiPAP 10/14/2013  . Compliance poor 10/14/2013  . Snoring 10/14/2013  . 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    Orientation RESPIRATION BLADDER Height & Weight     Self, Time, Situation, Place  Normal Continent Weight: 78.4 kg Height:  6\' 1"  (185.4 cm)  BEHAVIORAL SYMPTOMS/MOOD NEUROLOGICAL BOWEL NUTRITION STATUS      Continent Diet  AMBULATORY STATUS COMMUNICATION OF NEEDS Skin   Extensive Assist Verbally Normal                       Personal Care Assistance Level of Assistance  Bathing, Feeding, Dressing Bathing Assistance: Limited assistance Feeding assistance: Independent Dressing Assistance: Limited assistance     Functional Limitations Info  Sight, Hearing, Speech Sight Info: Adequate Hearing Info: Adequate Speech Info: Adequate    SPECIAL CARE FACTORS FREQUENCY  PT (By licensed PT), OT (By licensed OT)     PT Frequency: PT X5 OT Frequency: OT x5            Contractures Contractures Info: Not present    Additional Factors Info  Code Status, Allergies  Code Status Info: FULL Allergies Info: Lisinopril           Current Medications (02/28/2019):  This is the current hospital active medication list Current Facility-Administered Medications  Medication Dose Route Frequency Provider Last Rate Last Admin  . aspirin chewable tablet 81 mg  81 mg Oral Daily Jani Gravel, MD   81 mg at 02/28/19 1035  . atenolol (TENORMIN) tablet 25 mg  25 mg Oral Daily Jani Gravel, MD   25 mg at 02/28/19 1036  . atorvastatin (LIPITOR) tablet 80 mg  80 mg Oral Daily Jani Gravel, MD   80 mg at 02/28/19 1035  . chlorhexidine (PERIDEX) 0.12 % solution 15 mL  15 mL Mouth Rinse BID Doutova, Anastassia, MD   15 mL at 02/28/19 1035  . Chlorhexidine Gluconate Cloth 2 % PADS 6 each  6 each Topical Daily Toy Baker, MD   6 each at 02/28/19 1036  . enoxaparin (LOVENOX) injection 40 mg  40 mg Subcutaneous Q24H Pham, Anh P, RPH   40 mg at 02/27/19 1153  . insulin aspart (novoLOG) injection 0-9 Units  0-9 Units Subcutaneous TID WC Harold Hedge, MD   1 Units at 02/28/19 [id number]  . latanoprost (XALATAN) 0.005 % ophthalmic solution 1 drop  1 drop Both Eyes QHS Jani Gravel, MD   1 drop at 02/27/19 2132  . linagliptin (TRADJENTA) tablet 5 mg  5 mg Oral Daily Harold Hedge, MD   5  mg at 02/28/19 1035  . MEDLINE mouth rinse  15 mL Mouth Rinse q12n4p Doutova, Anastassia, MD   15 mL at 02/25/19 1743  . tamsulosin (FLOMAX) capsule 0.4 mg  0.4 mg Oral Daily Jani Gravel, MD   0.4 mg at 02/28/19 1035  . timolol (TIMOPTIC) 0.5 % ophthalmic solution 1 drop  1 drop Both Eyes BID Jani Gravel, MD   1 drop at 02/28/19 1036     Discharge Medications: Please see discharge summary for a list of discharge medications.  Relevant Imaging Results:  Relevant Lab Results:   Additional Information SS# [id number]  Purcell Mouton, RN

## 2019-02-28 NOTE — Progress Notes (Signed)
Physical Therapy Treatment Patient Details Name: Jerry Caldwell. MRN: [id number] DOB: [birth year] Today's Date: 02/28/2019    History of Present Illness Pt admitted from home with acute resp failure 2* CAP vs COVID (test is negative but still suspicious based on symptoms and imaging).  Pt with hx of DM,Glaucoma, and dementia    PT Comments    Pt asleep with lunch tray in front untouched.  Easily awoken. General Comments: required repeat function VC's to complete a task.  Upon standing was incont urine and unaware.  AxO to self only.  "foggy", "lost"  Assisted OOB to amb to bathroom was quite difficult.  General bed mobility comments: Increased time and repeat cueing to complete.  Impaired cognition. General transfer comment: VERY unsteady with impaired cognition.  Pt was incont urine upon standing and unaware.  Near  fall in bathroom when turning to toilet.  "Foggy" "dazed" and "confused" to his environment.  Strated he feels "woozy".  Standing BP 112/88 .  RA was good low 90's. General Gait Details: VERY unsteady gait with impaired cognition and staggering LEFT and RIGHT.  Required repeat commands to complete a functional task od "let's walk to the bathroom" and "lets go to the sink and wash your hands".  "dazed and confused" required repeat cueing and physical assist to prevent fall.  Follow Up Recommendations  Home health PT;SNF;Supervision/Assistance - 24 hour(spouse passed away due to [place] recently.  Pt will need 24/7 initial assist)     Equipment Recommendations       Recommendations for Other Services       Precautions / Restrictions Precautions Precautions: Fall Restrictions Weight Bearing Restrictions: (P) No    Mobility  Bed Mobility Overal bed mobility: Needs Assistance Bed Mobility: Supine to Sit;Sit to Supine     Supine to sit: Supervision;Min guard Sit to supine: Min guard   General bed mobility comments: Increased time and repeat cueing to complete.  Impaired  cognition.  Transfers Overall transfer level: Needs assistance Equipment used: None Transfers: Sit to/from [facility] Sit to Stand: Mod assist Stand pivot transfers: Mod assist       General transfer comment: VERY unsteady with impaired cognition.  Pt was incont urine upon standing and unaware.  Near  fall in bathroom when turning to toilet.  "Foggy" "dazed" and "confused" to his environment.  Strated he feels "woozy".  Standing BP 112/88 .  RA was good low 90's.  Ambulation/Gait Ambulation/Gait assistance: Mod assist Gait Distance (Feet): 6 Feet Assistive device: None Gait Pattern/deviations: Step-to pattern;Step-through pattern;Staggering right;Drifts right/left;Trunk flexed Gait velocity: decreased   General Gait Details: VERY unsteady gait with impaired cognition and staggering LEFT and RIGHT.  Required repeat commands to complete a functional task od "let's walk to the bathroom" and "lets go to the sink and wash your hands".  "dazed and confused" required repeat cueing and physical assist to prevent fall.   Stairs             Wheelchair Mobility    Modified Rankin (Stroke Patients Only)       Balance                                            Cognition Arousal/Alertness: Awake/alert Behavior During Therapy: Flat affect Overall Cognitive Status: No family/caregiver present to determine baseline cognitive functioning  General Comments: required repeat function VC's to complete a task.  Upon standing was incont urine and unaware.  AxO to self only.  "foggy", "lost"      Exercises      General Comments        Pertinent Vitals/Pain Pain Assessment: No/denies pain    Home Living                      Prior Function            PT Goals (current goals can now be found in the care plan section) Progress towards PT goals: Progressing toward goals    Frequency     Min 3X/week      PT Plan Current plan remains appropriate    Co-evaluation              AM-PAC PT "6 Clicks" Mobility   Outcome Measure  Help needed turning from your back to your side while in a flat bed without using bedrails?: A Lot Help needed moving from lying on your back to sitting on the side of a flat bed without using bedrails?: A Lot Help needed moving to and from a bed to a chair (including a wheelchair)?: A Lot Help needed standing up from a chair using your arms (e.g., wheelchair or bedside chair)?: A Lot Help needed to walk in hospital room?: A Lot Help needed climbing 3-5 steps with a railing? : Total 6 Click Score: 11    End of Session Equipment Utilized During Treatment: Gait belt Activity Tolerance: Patient limited by fatigue;Other (comment)(impaired cognition) Patient left: in bed;with call bell/phone within reach Nurse Communication: Mobility status PT Visit Diagnosis: Muscle weakness (generalized) (M62.81);Difficulty in walking, not elsewhere classified (R26.2);Unsteadiness on feet (R26.81)     Time: 1335-1400 PT Time Calculation (min) (ACUTE ONLY): 25 min  Charges:  $Gait Training: 8-22 mins $Therapeutic Activity: 8-22 mins                     Rica Koyanagi  PTA Acute  Rehabilitation Services Pager      [phone] Office      [phone]

## 2019-02-28 NOTE — TOC Progression Note (Signed)
Transition of Care (TOC) - Progression Note    Patient Details  Name: Jerry Caldwell. MRN: [id number] Date of Birth: 26-Jul-1945  [facility] Care [facility]) CM/SW Contact  Purcell Mouton, RN Phone Number: 02/28/2019, 3:17 PM  Clinical Narrative:    Spoke with pt and both daughters concerning SNF and home. Pt's daughter are taking pt home with [facility] and one of the daughters will stay with pt. However, daughter will not be able to pick pt up until 7:00PM related to her traveling from [place], [place] after she is off work. MD aware. [facility] will service pt with HH needs and [facility] for RW.          Expected Discharge Plan and Services           Expected Discharge Date: 02/28/19                                     Social Determinants of Health (SDOH) Interventions    Readmission Risk Interventions No flowsheet data found.

## 2019-02-28 NOTE — Progress Notes (Signed)
PROGRESS NOTE    Jerry Caldwell.    Code Status: Full Code  VEL:[id number] DOB: [birth year] DOA: 02/21/2019  PCP: Haywood Pao, MD    Hospital Summary  73 y.o.male,w hypertension, hyperlipidemia, Dm2, Bph, OSA presents with dyspnea x 3 days with hypoxia in ED. Despite symptoms and lab values consistent with COVID-19, patient has had negative respiratory panel as well as COVID-19 negative so far and has been on treatment for CAP as well as COVID-19 with remdesivir and steroids. Significantly elevated initial D-dimer > 20, started on full dose lovenox.  Negative CTA chest for PE, negative bilateral lower extremity Doppler for DVT. D Dimer decreased and lovenox changed to intermediate dosing  Transferred to MedSurg 2/6  A & P   Principal Problem:   Acute respiratory failure with hypoxia (HCC) Active Problems:   Suspected COVID-19 virus infection   Pneumonia  Sepsis with Acute respiratory failure with hypoxia secondary to CAP and likelycovid-19 Sepsis on admission with elevated lactate, hypoxia, tachycardia presumed COVID 19/CAP. Sepsis as resolved. Tolerating room air Keep pulse ox on earlobe, inaccurate on finger -Blood culture x2no growth to date -Urine strepand legionellaantigens negative -MRSA swab neg, thus vanocmycin discontinued -Completed 5 days Rocephin/azithromycin -ambulatory pulse ox  Presumed covid-19 infection -COVID antigen neg, COVID RNA negative -Positive COVID-19 contact in wife -CRP elevated at 9.8 on admission, elevated ferritin, neg procal -Treat empirically for COVID 19 with 5 days remdesivir  -Completed 7 days Decadron, discontinued 2/8 as not requiring O2 and having some hospital-acquired delirium. -Ddimer >20->5.86->3.5. CTA chest reviewed, neg for PE but does confirm marked diffuse B infiltrates. -Will likely be discharged on Aspirin 162 mg x 30 days then resume normal dose -Incentive spirometry  Steroid-induced  hyperglycemia Dm2 -treated with Tradjenta and sliding scale -discharge on home regimen  Dementia Mild hospital-acquired delirium -Discontinue Decadron -Delirium precautions  Hypernatremia - resolved with D5W  ARF, unknown baseline -Likely from dehydration, resolved with D5W -Renal US reviewed. Findings of medical renal disease  Hypertension -Cont Atenolol 12m po qdayas tolerated -Stable off HCTZ and losartan which were held due to AKI  Hyperlipidemia -Cont Lipitor 858mpo qhs  Bph -Cont Flomaxas tolerated  Glaucoma -Cont TimopticandXalatanas tolerated  DVT prophylaxis: Lovenox per pharmacy Family Communication: Patient's daughter has been updated by phone 2/7 Disposition Plan: Patient medically stable for discharge.  Pending SNF bed placement  Consultants  None  Procedures  None  Antibiotics   Anti-infectives (From admission, onward)   Start     Dose/Rate Route Frequency Ordered Stop   02/23/19 1000  remdesivir 100 mg in sodium chloride 0.9 % 100 mL IVPB     100 mg 200 mL/hr over 30 Minutes Intravenous Daily 02/22/19 0721 02/26/19 1019   02/23/19 1000  azithromycin (ZITHROMAX) tablet 500 mg  Status:  Discontinued     500 mg Oral Daily 02/23/19 0725 02/25/19 1346   02/22/19 1000  remdesivir 200 mg in sodium chloride 0.9% 250 mL IVPB     200 mg 580 mL/hr over 30 Minutes Intravenous Once 02/22/19 0721 02/22/19 1900   02/22/19 0800  vancomycin (VANCOREADY) IVPB 1750 mg/350 mL  Status:  Discontinued     1,750 mg 175 mL/hr over 120 Minutes Intravenous Every 24 hours 02/22/19 0311 02/22/19 0733   02/22/19 0400  cefTRIAXone (ROCEPHIN) 2 g in sodium chloride 0.9 % 100 mL IVPB  Status:  Discontinued     2 g 200 mL/hr over 30 Minutes Intravenous Daily 02/22/19 0256 02/25/19 1346   02/22/19  0300  azithromycin (ZITHROMAX) 500 mg in sodium chloride 0.9 % 250 mL IVPB  Status:  Discontinued     500 mg 250 mL/hr over 60 Minutes Intravenous Daily 02/22/19 0256  02/23/19 0725   02/21/19 2130  vancomycin (VANCOCIN) IVPB 1000 mg/200 mL premix     1,000 mg 200 mL/hr over 60 Minutes Intravenous  Once 02/21/19 2118 02/22/19 0007   02/21/19 2130  piperacillin-tazobactam (ZOSYN) IVPB 3.375 g     3.375 g 12.5 mL/hr over 240 Minutes Intravenous  Once 02/21/19 2118 02/21/19 2300           Subjective   Reports that he has some confusion today.  Having a hard time piecing together the past several days and weeks.  Knows that he is in the hospital but not sure for what.  He seems slightly concerned that he is lost the sense of orientation.  Otherwise denies any shortness of breath chest pain or palpitations or any other complaints.  Objective   Vitals:   02/27/19 2034 02/28/19 0500 02/28/19 0553 02/28/19 1207  BP: 130/82  135/87 127/84  Pulse: 72  67 64  Resp: '20  20 18  ' Temp: 97.7 F (36.5 C)  97.8 F (36.6 C) 98 F (36.7 C)  TempSrc: Oral  Oral Oral  SpO2: 100%  90% 95%  Weight:  78.4 kg    Height:        Intake/Output Summary (Last 24 hours) at 02/28/2019 1250 Last data filed at 02/28/2019 7276 Gross per 24 hour  Intake 360 ml  Output 2000 ml  Net -1640 ml   Filed Weights   02/25/19 0628 02/26/19 0452 02/28/19 0500  Weight: 78.6 kg 78.5 kg 78.4 kg    Examination:  Physical Exam Vitals and nursing note reviewed.  Constitutional:      General: He is not in acute distress.    Appearance: Normal appearance.  HENT:     Head: Normocephalic and atraumatic.  Cardiovascular:     Rate and Rhythm: Normal rate and regular rhythm.  Pulmonary:     Effort: Pulmonary effort is normal.     Breath sounds: Normal breath sounds.  Abdominal:     General: Abdomen is flat.     Palpations: Abdomen is soft.  Neurological:     Mental Status: He is alert.     Comments: Oriented to place and person.   Poor memory  Psychiatric:        Mood and Affect: Mood normal.        Behavior: Behavior normal.     Data Reviewed: I have personally reviewed  following labs and imaging studies  CBC: Recent Labs  Lab 02/21/19 1816 02/21/19 2226 02/22/19 1314 02/23/19 0307 02/24/19 0229 02/25/19 0213 02/26/19 0202  WBC 7.7   < > 8.5 11.5* 11.0* 9.5 11.5*  NEUTROABS 6.1  --   --  9.9* 9.3*  --   --   HGB 14.9   < > 13.1 13.2 13.7 14.0 13.8  HCT 46.2   < > 41.1 42.7 43.2 44.0 43.3  MCV 81.9   < > 83.5 84.9 83.6 83.7 83.3  PLT 362   < > 362 389 395 375 431*   < > = values in this interval not displayed.   Basic Metabolic Panel: Recent Labs  Lab 02/23/19 1605 02/24/19 0229 02/25/19 0213 02/26/19 0202 02/27/19 0400  NA 146* 143 145 145 144  K 3.7 3.9 4.6 4.1 4.7  CL 109 107 113* 114*  114*  CO2 '26 24 25 ' 20* 21*  GLUCOSE 158* 147* 202* 195* 185*  BUN 38* 32* 27* 28* 28*  CREATININE 1.36* 1.14 1.20 1.19 1.15  CALCIUM 9.2 9.1 9.1 9.1 9.0  MG  --  2.5*  --  2.3  --    GFR: Estimated Creatinine Clearance: 65.3 mL/min (by C-G formula based on SCr of 1.15 mg/dL). Liver Function Tests: Recent Labs  Lab 02/21/19 1816 02/22/19 1314 02/23/19 0307 02/24/19 0229  AST 39 26 27 36  ALT '24 21 22 30  ' ALKPHOS 76 68 62 63  BILITOT 1.2 1.0 1.1 1.0  PROT 8.6* 7.5 7.2 6.9  ALBUMIN 3.2* 2.9* 2.8* 2.6*   No results for input(s): LIPASE, AMYLASE in the last 168 hours. No results for input(s): AMMONIA in the last 168 hours. Coagulation Profile: No results for input(s): INR, PROTIME in the last 168 hours. Cardiac Enzymes: No results for input(s): CKTOTAL, CKMB, CKMBINDEX, TROPONINI in the last 168 hours. BNP (last 3 results) No results for input(s): PROBNP in the last 8760 hours. HbA1C: No results for input(s): HGBA1C in the last 72 hours. CBG: Recent Labs  Lab 02/27/19 1149 02/27/19 1556 02/27/19 2041 02/28/19 0757 02/28/19 1203  GLUCAP 141* 85 99 143* 109*   Lipid Profile: No results for input(s): CHOL, HDL, LDLCALC, TRIG, CHOLHDL, LDLDIRECT in the last 72 hours. Thyroid Function Tests: No results for input(s): TSH, T4TOTAL,  FREET4, T3FREE, THYROIDAB in the last 72 hours. Anemia Panel: Recent Labs    02/26/19 0202  FERRITIN 1,175*   Sepsis Labs: Recent Labs  Lab 02/21/19 1816 02/21/19 2348 02/22/19 0310  PROCALCITON <0.10  --   --   LATICACIDVEN 3.0* 1.5 1.9    Recent Results (from the past 240 hour(s))  Blood culture (routine x 2)     Status: None   Collection Time: 02/21/19  6:10 PM   Specimen: BLOOD  Result Value Ref Range Status   Specimen Description   Final    BLOOD LEFT ANTECUBITAL Performed at [facility], [address] [place], [place] [address]    Special Requests   Final    BOTTLES DRAWN AEROBIC AND ANAEROBIC Blood Culture results may not be optimal due to an inadequate volume of blood received in culture bottles   Culture   Final    NO GROWTH 5 DAYS Performed at [facility], [place] [address] [place], [place] [address]    Report Status 02/26/2019 FINAL  Final  Blood culture (routine x 2)     Status: None   Collection Time: 02/21/19  6:15 PM   Specimen: BLOOD  Result Value Ref Range Status   Specimen Description   Final    BLOOD LEFT ANTECUBITAL Performed at [facility], [address] [place], [place] [address]    Special Requests   Final    BOTTLES DRAWN AEROBIC AND ANAEROBIC Blood Culture adequate volume Performed at [facility], [address] [place], [place] [address]    Culture   Final    NO GROWTH 5 DAYS Performed at [facility], [place] [address], [place], [place] [address]    Report Status 02/26/2019 FINAL  Final  SARS Coronavirus 2 Ag (30 min TAT) - Nasal Swab (BD Veritor Kit)     Status: None   Collection Time: 02/21/19  6:16 PM   Specimen: Nasal Swab (BD Veritor Kit)  Result Value Ref Range Status   SARS  Coronavirus 2 Ag NEGATIVE NEGATIVE Final    Comment: (NOTE) SARS-CoV-2 antigen NOT DETECTED.  Negative results are presumptive.  Negative results do not preclude SARS-CoV-2 infection and should not be used  as the sole basis for treatment or other patient management decisions, including infection  control decisions, particularly in the presence of clinical signs and  symptoms consistent with COVID-19, or in those who have been in contact with the virus.  Negative results must be combined with clinical observations, patient history, and epidemiological information. The expected result is Negative. Fact Sheet for Patients: PodPark.tn Fact Sheet for Healthcare Providers: GiftContent.is This test is not yet approved or cleared by the [place] FDA and  has been authorized for detection and/or diagnosis of SARS-CoV-2 by FDA under an Emergency Use Authorization (EUA).  This EUA will remain in effect (meaning this test can be used) for the duration of  the COVID-19 de claration under Section 564(b)(1) of the Act, 21 U.S.C. section 360bbb-3(b)(1), unless the authorization is terminated or revoked sooner. Performed at [facility], [address] [place], [place] [address]   SARS Coronavirus 2 by RT PCR (hospital order, performed in [facility] hospital lab) Nasopharyngeal Nasopharyngeal Swab     Status: None   Collection Time: 02/21/19  7:07 PM   Specimen: Nasopharyngeal Swab  Result Value Ref Range Status   SARS Coronavirus 2 NEGATIVE NEGATIVE Final    Comment: Performed at [facility], [address] [place], [place] [address]  SARS CORONAVIRUS 2 (TAT 6-24 HRS) Nasopharyngeal Nasopharyngeal Swab     Status: None   Collection Time: 02/22/19  2:50 AM   Specimen: Nasopharyngeal Swab  Result Value Ref Range Status   SARS Coronavirus 2 NEGATIVE NEGATIVE Final    Comment: (NOTE) SARS-CoV-2 target nucleic acids are NOT DETECTED. The SARS-CoV-2 RNA is generally detectable in upper and lower respiratory specimens during the acute phase of infection. Negative results do not preclude SARS-CoV-2 infection, do not  rule out co-infections with other pathogens, and should not be used as the sole basis for treatment or other patient management decisions. Negative results must be combined with clinical observations, patient history, and epidemiological information. The expected result is Negative. Fact Sheet for Patients: SugarRoll.be Fact Sheet for Healthcare Providers: https://www.woods-mathews.com/ This test is not yet approved or cleared by the [place] FDA and  has been authorized for detection and/or diagnosis of SARS-CoV-2 by FDA under an Emergency Use Authorization (EUA). This EUA will remain  in effect (meaning this test can be used) for the duration of the COVID-19 declaration under Section 56 4(b)(1) of the Act, 21 U.S.C. section 360bbb-3(b)(1), unless the authorization is terminated or revoked sooner. Performed at [facility], [place] [address], [place], [place] [address]   Respiratory Panel by PCR     Status: None   Collection Time: 02/22/19  2:50 AM   Specimen: Nasopharyngeal Swab; Respiratory  Result Value Ref Range Status   Adenovirus NOT DETECTED NOT DETECTED Final   Coronavirus 229E NOT DETECTED NOT DETECTED Final    Comment: (NOTE) The Coronavirus on the Respiratory Panel, DOES NOT test for the novel  Coronavirus (2019 nCoV)    Coronavirus HKU1 NOT DETECTED NOT DETECTED Final   Coronavirus NL63 NOT DETECTED NOT DETECTED Final   Coronavirus OC43 NOT DETECTED NOT DETECTED Final   Metapneumovirus NOT DETECTED NOT DETECTED Final   Rhinovirus / Enterovirus NOT DETECTED NOT DETECTED Final   Influenza A NOT DETECTED  NOT DETECTED Final   Influenza B NOT DETECTED NOT DETECTED Final   Parainfluenza Virus 1 NOT DETECTED NOT DETECTED Final   Parainfluenza Virus 2 NOT DETECTED NOT DETECTED Final   Parainfluenza Virus 3 NOT DETECTED NOT DETECTED Final   Parainfluenza Virus 4 NOT DETECTED NOT DETECTED Final   Respiratory Syncytial Virus  NOT DETECTED NOT DETECTED Final   Bordetella pertussis NOT DETECTED NOT DETECTED Final   Chlamydophila pneumoniae NOT DETECTED NOT DETECTED Final   Mycoplasma pneumoniae NOT DETECTED NOT DETECTED Final    Comment: Performed at [facility], [place] [address], [place], [place] [address]  MRSA PCR Screening     Status: None   Collection Time: 02/22/19  2:50 AM   Specimen: Nasopharyngeal  Result Value Ref Range Status   MRSA by PCR NEGATIVE NEGATIVE Final    Comment:        The GeneXpert MRSA Assay (FDA approved for NASAL specimens only), is one component of a comprehensive MRSA colonization surveillance program. It is not intended to diagnose MRSA infection nor to guide or monitor treatment for MRSA infections. Performed at [facility], [place] [address] [place], [place] [address]          Radiology Studies: No results found.      Scheduled Meds: . aspirin  81 mg Oral Daily  . atenolol  25 mg Oral Daily  . atorvastatin  80 mg Oral Daily  . chlorhexidine  15 mL Mouth Rinse BID  . Chlorhexidine Gluconate Cloth  6 each Topical Daily  . enoxaparin (LOVENOX) injection  40 mg Subcutaneous Q24H  . insulin aspart  0-9 Units Subcutaneous TID WC  . latanoprost  1 drop Both Eyes QHS  . linagliptin  5 mg Oral Daily  . mouth rinse  15 mL Mouth Rinse q12n4p  . tamsulosin  0.4 mg Oral Daily  . timolol  1 drop Both Eyes BID   Continuous Infusions:    LOS: 7 days    Time spent: 20 minutes with over 50% of the time coordinating the patient's care    Harold Hedge, DO [facility] Pager [phone]  If 7PM-7AM, please contact night-coverage [email or web address] Password [facility] 02/28/2019, 12:50 PM

## 2019-03-02 NOTE — TOC Progression Note (Signed)
Transition of Care (TOC) - Progression Note    Patient Details  Name: Jerry Caldwell. MRN: [id number] Date of Birth: [birth year]  Transition of Care [facility]) CM/SW Contact  Purcell Mouton, RN Phone Number: 03/02/2019, 10:26 AM  Clinical Narrative:    Opened discharge chart related to daughter calling to ask about [facility] and if she Could change pt's appointment. Daugther [name] [phone] also states that pt did not receive his discharge papers. Information passed on to CN.         Expected Discharge Plan and Services           Expected Discharge Date: 02/28/19                                     Social Determinants of Health (SDOH) Interventions    Readmission Risk Interventions No flowsheet data found.

## 2019-03-06 ENCOUNTER — Ambulatory Visit: Payer: Medicare Other

## 2019-03-14 ENCOUNTER — Other Ambulatory Visit (HOSPITAL_COMMUNITY): Payer: Self-pay | Admitting: Internal Medicine

## 2019-03-14 ENCOUNTER — Ambulatory Visit (HOSPITAL_COMMUNITY)
Admission: RE | Admit: 2019-03-14 | Discharge: 2019-03-14 | Disposition: A | Discharge status: Home / Self Care | Payer: Medicare Other | Source: Ambulatory Visit | Attending: Surgery | Admitting: Surgery

## 2019-03-14 ENCOUNTER — Other Ambulatory Visit: Payer: Self-pay

## 2019-03-14 DIAGNOSIS — R600 Localized edema: Secondary | ICD-10-CM | POA: Diagnosis present

## 2019-03-24 ENCOUNTER — Ambulatory Visit: Payer: Medicare Other | Attending: Internal Medicine

## 2019-03-24 DIAGNOSIS — Z23 Encounter for immunization: Secondary | ICD-10-CM | POA: Insufficient documentation

## 2019-03-24 NOTE — Progress Notes (Signed)
   [id number] Vaccination Clinic  Name:  Olanrewaju Ortel.    MRN: [id number] DOB: May 21, 1945  03/24/2019  Mr. Unthank was observed post Covid-19 immunization for 15 minutes without incident. He was provided with Vaccine Information Sheet and instruction to access the V-Safe system.   Mr. Bertani was instructed to call 911 with any severe reactions post vaccine: Marland Kitchen Difficulty breathing  . Swelling of face and throat  . A fast heartbeat  . A bad rash all over body  . Dizziness and weakness   Immunizations Administered    Name Date Dose VIS Date Route   Pfizer COVID-19 Vaccine 03/24/2019  8:36 AM 0.3 mL 12/31/2018 Intramuscular   Manufacturer: [place]   Lot: [id number]   [place]: [id number]

## 2019-03-29 ENCOUNTER — Telehealth: Payer: Self-pay | Admitting: Neurology

## 2019-03-29 NOTE — Telephone Encounter (Signed)
Pt called stating his cpap machine is not working and would like to know who he needs to contact and what he should do.

## 2019-03-29 NOTE — Telephone Encounter (Signed)
Called the patient back, there was no answer. LVM advising the patient that the first step would be to contact PCP to get a sleep consult sent over to our office. Pt has not been seen in the office since 2015 and since over 3 yrs would require a sleep consult.

## 2019-03-29 NOTE — Telephone Encounter (Addendum)
Pt called back and informed him of RN message fyi

## 2019-04-19 ENCOUNTER — Ambulatory Visit: Payer: Medicare Other | Attending: Internal Medicine

## 2019-04-19 DIAGNOSIS — Z23 Encounter for immunization: Secondary | ICD-10-CM

## 2019-04-19 NOTE — Progress Notes (Signed)
   [id number] Vaccination Clinic  Name:  Jerry Caldwell.    MRN: [id number] DOB: [birth year]  04/19/2019  Jerry Caldwell was observed post Covid-19 immunization for 15 minutes without incident. He was provided with Vaccine Information Sheet and instruction to access the V-Safe system.   Jerry Caldwell was instructed to call 911 with any severe reactions post vaccine: Marland Kitchen Difficulty breathing  . Swelling of face and throat  . A fast heartbeat  . A bad rash all over body  . Dizziness and weakness   Immunizations Administered    Name Date Dose VIS Date Route   Pfizer COVID-19 Vaccine 04/19/2019  1:45 PM 0.3 mL 12/31/2018 Intramuscular   Manufacturer: [place]   Lot: [id number]   [place]: [id number]

## 2019-09-12 ENCOUNTER — Other Ambulatory Visit: Payer: Self-pay | Admitting: Registered Nurse

## 2019-09-12 DIAGNOSIS — R413 Other amnesia: Secondary | ICD-10-CM

## 2019-10-02 ENCOUNTER — Other Ambulatory Visit: Payer: Medicare Other

## 2020-06-26 IMAGING — CT CT ANGIO CHEST
2 of 8 series · 19 of 36 positions shown · IV contrast (Omnipaque)
Comparison: None.

CLINICAL DATA: Shortness of breath x3 days.

EXAM:
CT ANGIOGRAPHY CHEST WITH CONTRAST
TECHNIQUE: Multidetector CT imaging of the chest was performed using the
standard protocol during bolus administration of intravenous
contrast. Multiplanar CT image reconstructions and MIPs were
obtained to evaluate the vascular anatomy.
CONTRAST:  100mL OMNIPAQUE IOHEXOL 350 MG/ML SOLN

[Series 6: pe coronal mpr · coronal · 0.66mm/px · 1 of 135 slices shown]
[im 68/135  mediastinal]
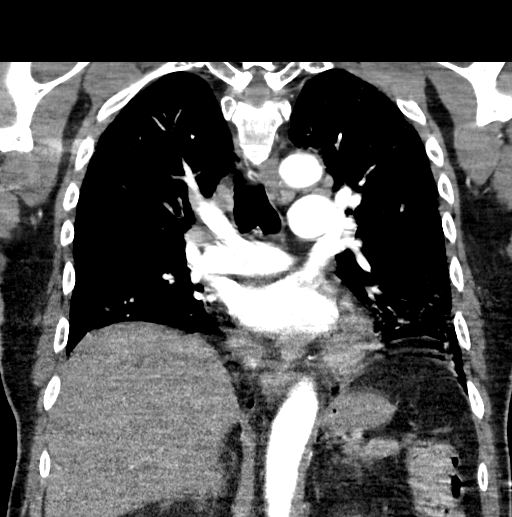

[Series 10: pe thins · axial · 0.79mm/px · z∈[-109,+187]mm · 18 of 332 slices shown]
[im 18/332  lung]
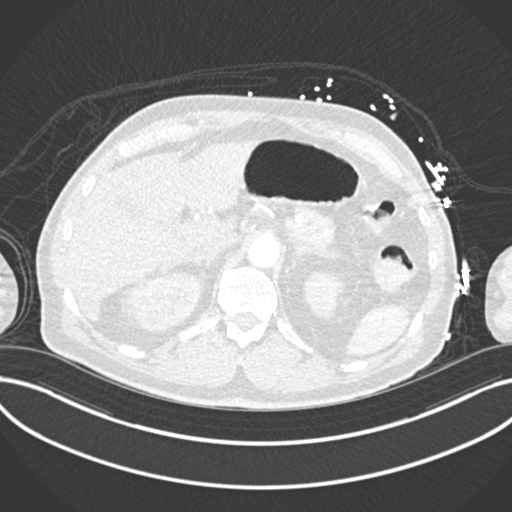
[im 35/332  mediastinal]
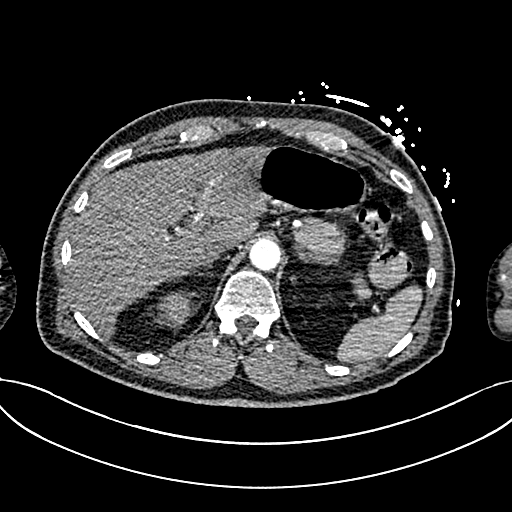
[im 53/332  lung]
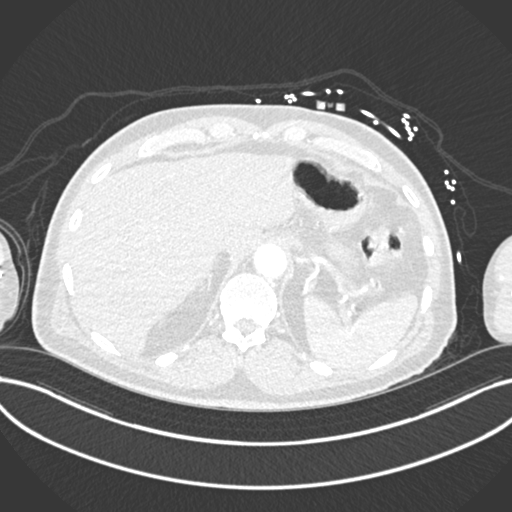
[im 70/332  mediastinal]
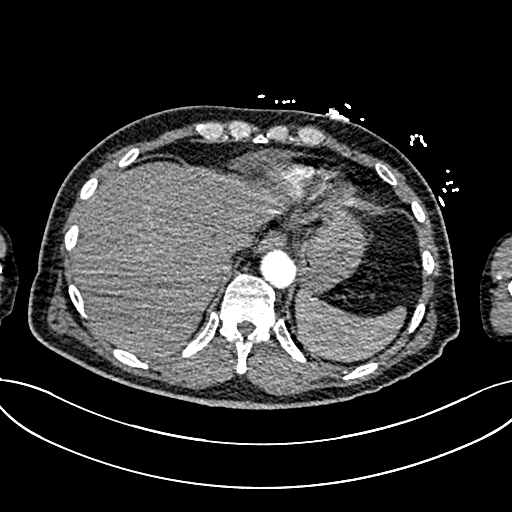
[im 88/332  lung]
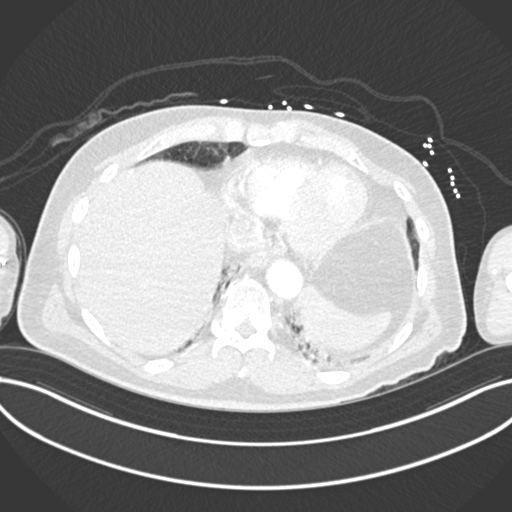
[im 105/332  mediastinal]
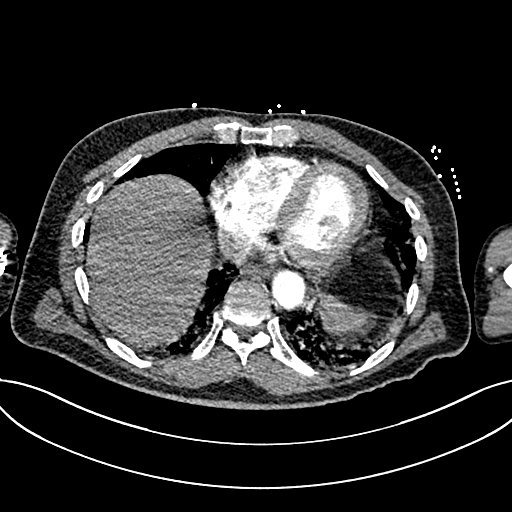
[im 122/332  lung]
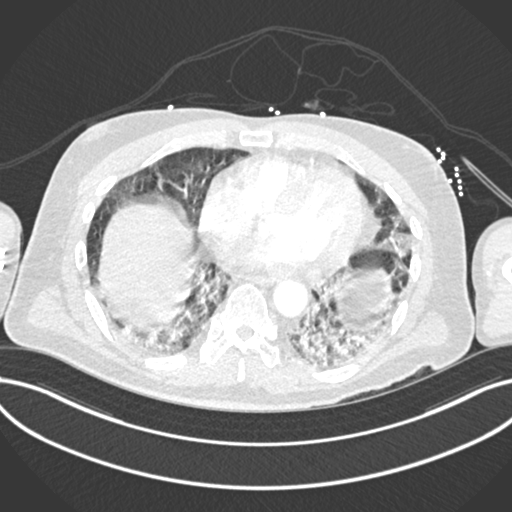
[im 140/332  mediastinal]
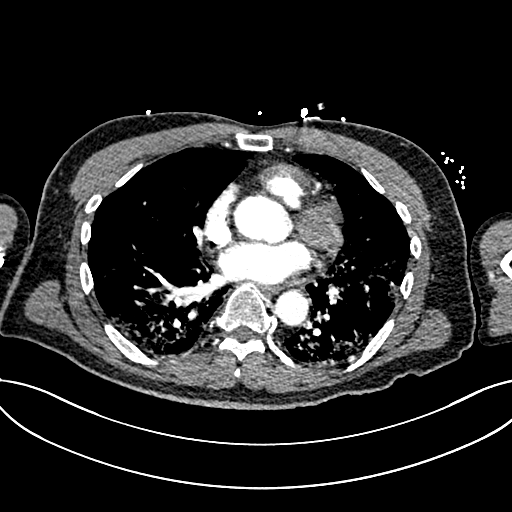
[im 157/332  lung]
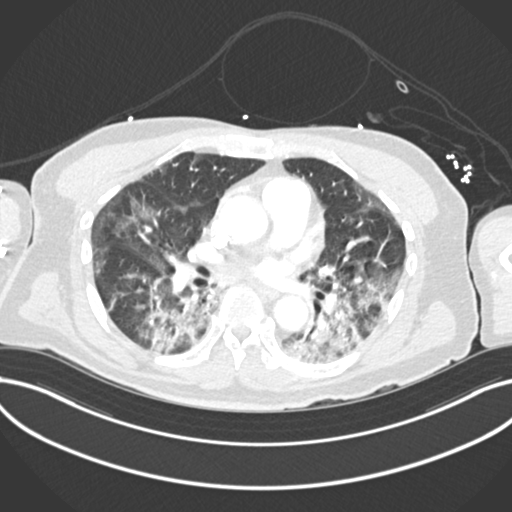
[im 175/332  mediastinal]
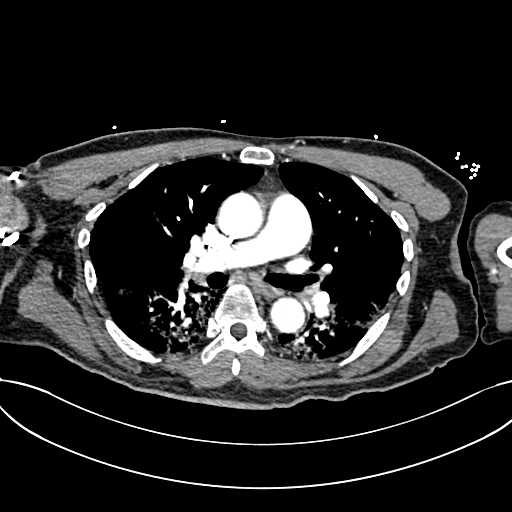
[im 192/332  lung]
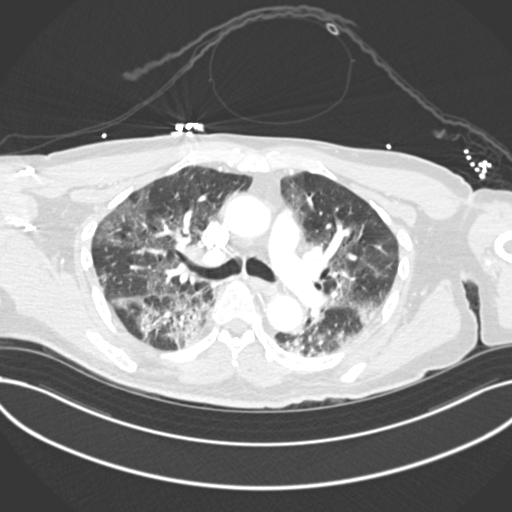
[im 210/332  mediastinal]
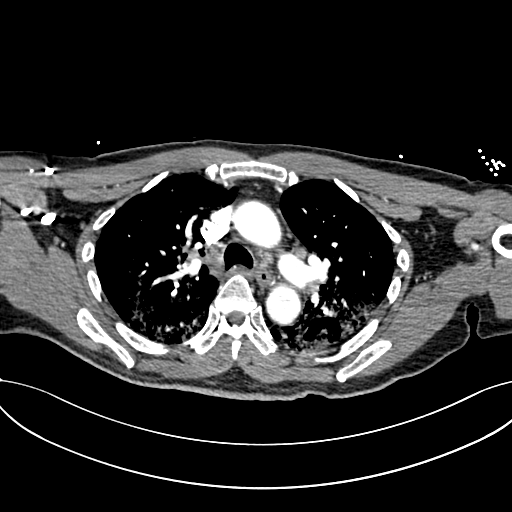
[im 227/332  lung]
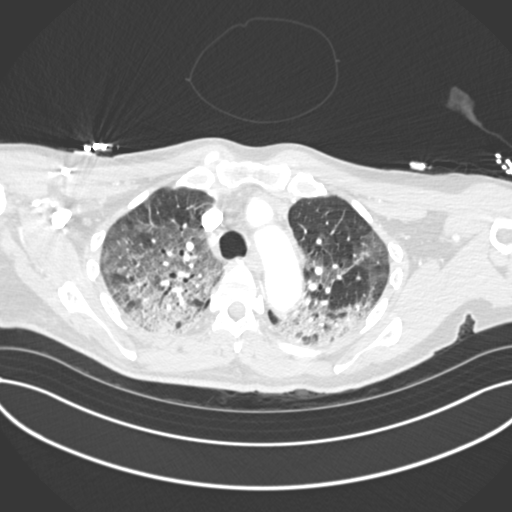
[im 244/332  mediastinal]
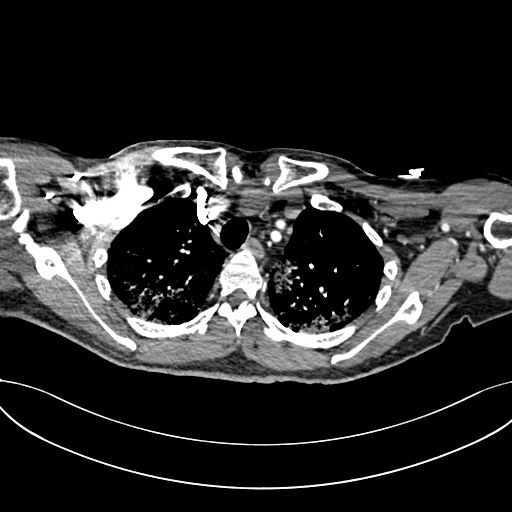
[im 262/332  lung]
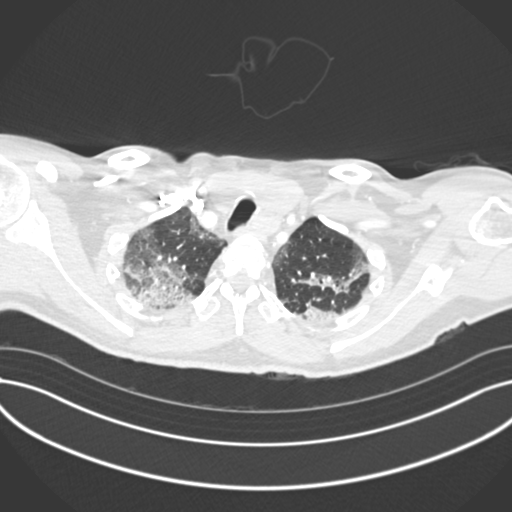
[im 279/332  mediastinal]
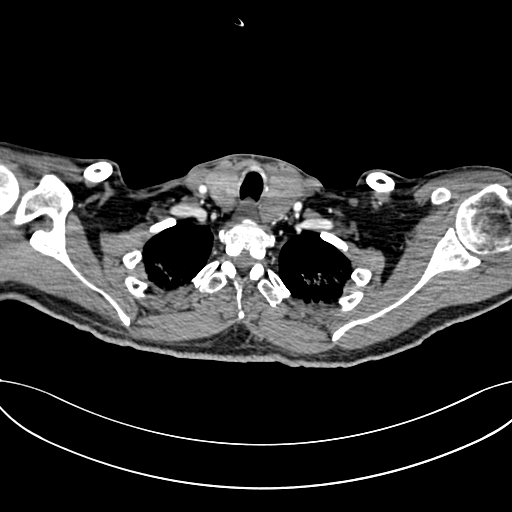
[im 297/332  lung]
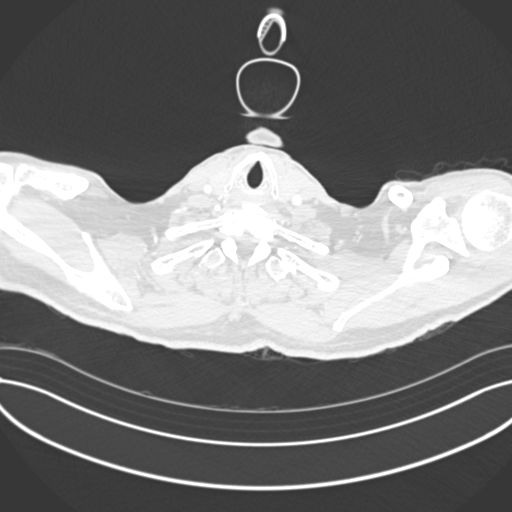
[im 314/332  mediastinal]
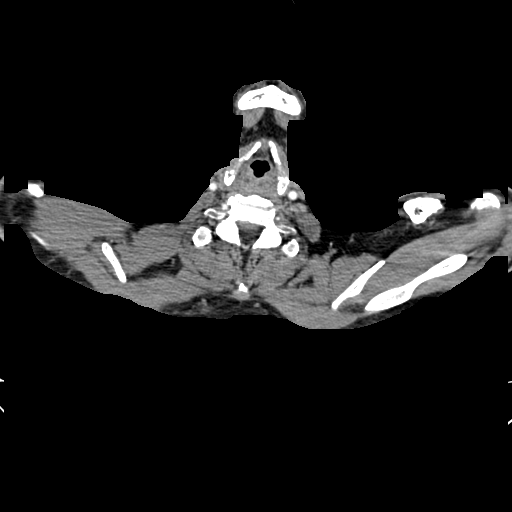

[19 of 36 positions shown; findings below may reference images not displayed]

FINDINGS: Cardiovascular: Satisfactory opacification of the pulmonary arteries
to the segmental level. No evidence of pulmonary embolism. Normal
heart size. No pericardial effusion.

Mediastinum/Nodes: No enlarged mediastinal, hilar, or axillary lymph
nodes. Thyroid gland, trachea, and esophagus demonstrate no
significant findings.

Lungs/Pleura: Marked severity, predominately ground-glass appearing,
infiltrates are seen throughout both lungs.

There is no evidence of a pleural effusion or pneumothorax.

Upper Abdomen: A 3.5 cm x 3.8 cm area of heterogeneous low
attenuation is seen along the lateral aspect of the mid right
kidney.

Musculoskeletal: No chest wall abnormality. No acute or significant
osseous findings.

Review of the MIP images confirms the above findings.
IMPRESSION: 1. Marked severity diffuse bilateral infiltrates.
2. Area of low attenuation within the right kidney. While this may
represent a renal cyst, correlation with renal ultrasound is
recommended to exclude the presence of a soft tissue component.

## 2020-06-27 IMAGING — US US RENAL
1 series · 13 of 25 positions shown · non-contrast
Comparison: Chest CT 02/21/2019

CLINICAL DATA: Follow-up right renal lesions seen on recent chest
CT.

EXAM:
RENAL / URINARY TRACT ULTRASOUND COMPLETE

[Series 1: us renal · 13 of 102 slices shown]
[im 1/102]
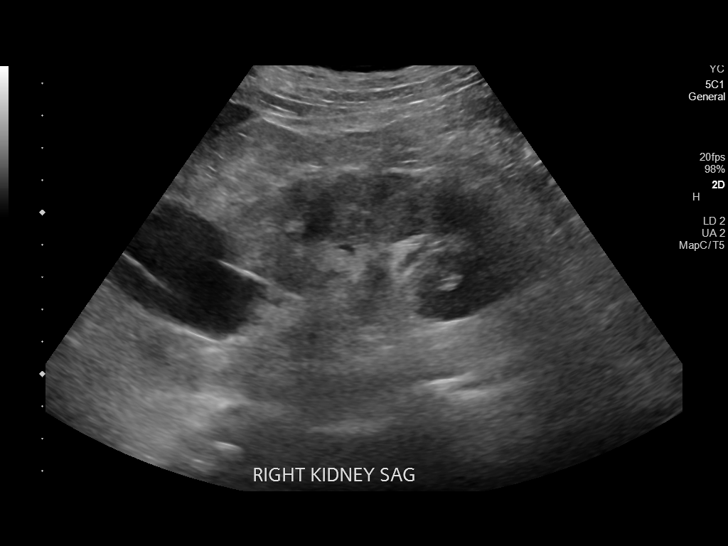
[im 9/102]
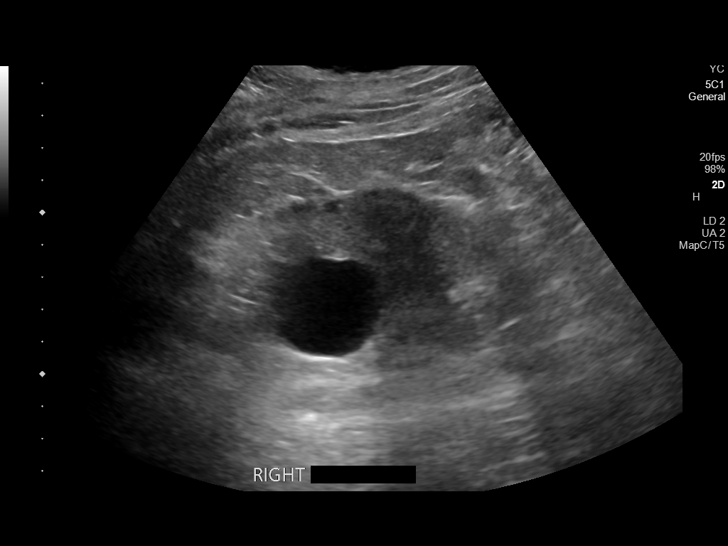
[im 17/102]
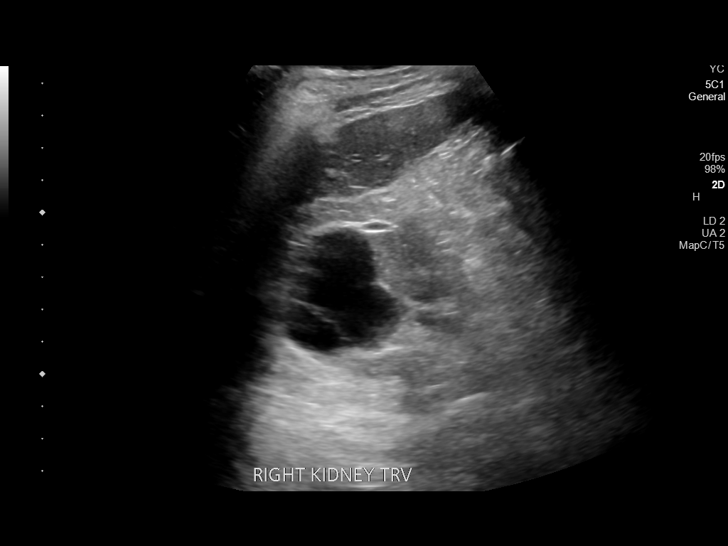
[im 26/102]
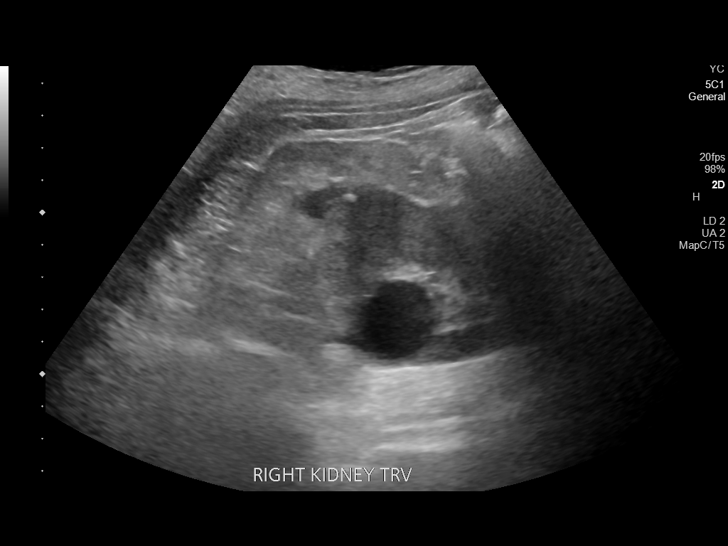
[im 34/102]
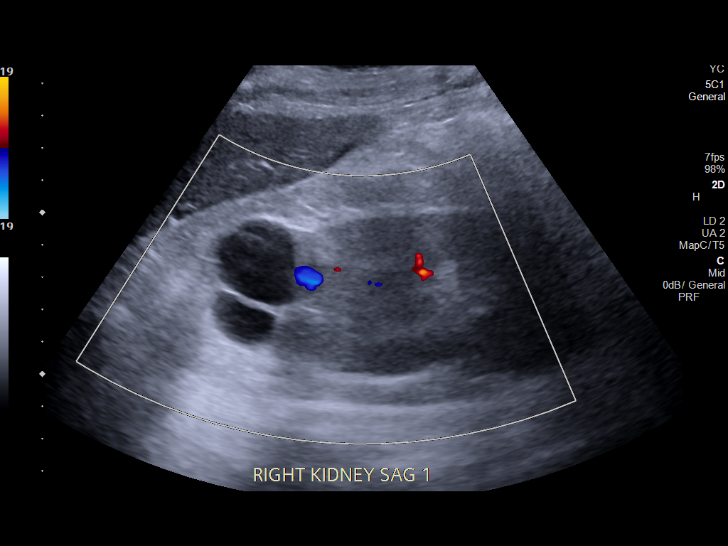
[im 43/102]
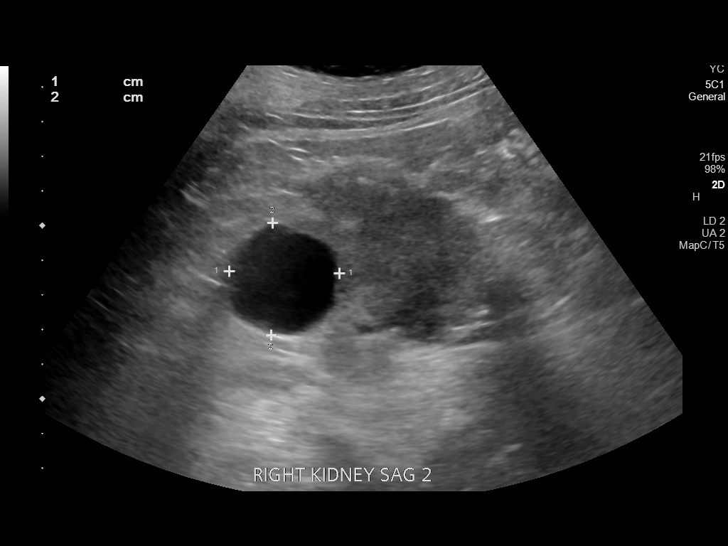
[im 51/102]
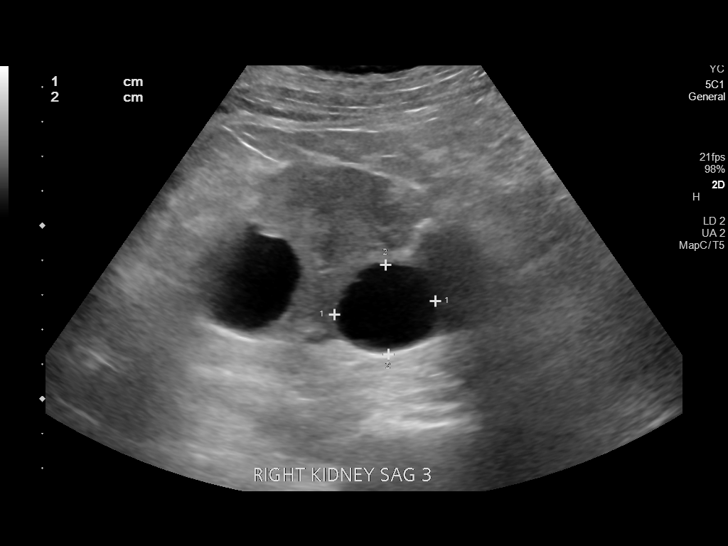
[im 59/102]
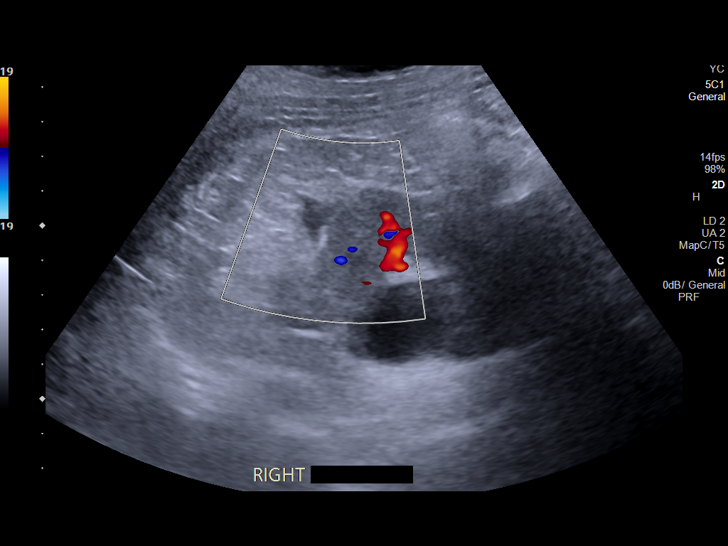
[im 68/102]
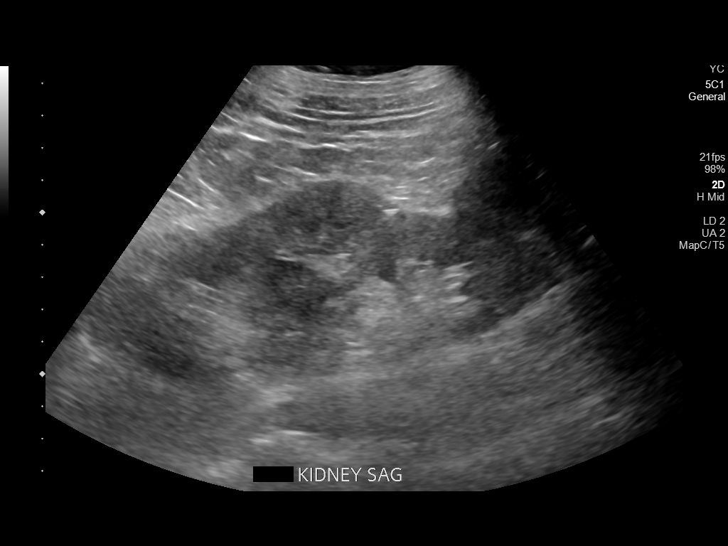
[im 76/102]
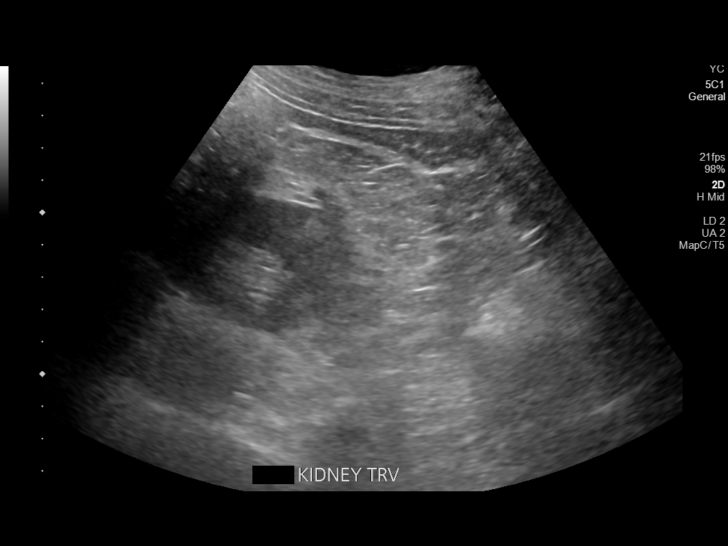
[im 85/102]
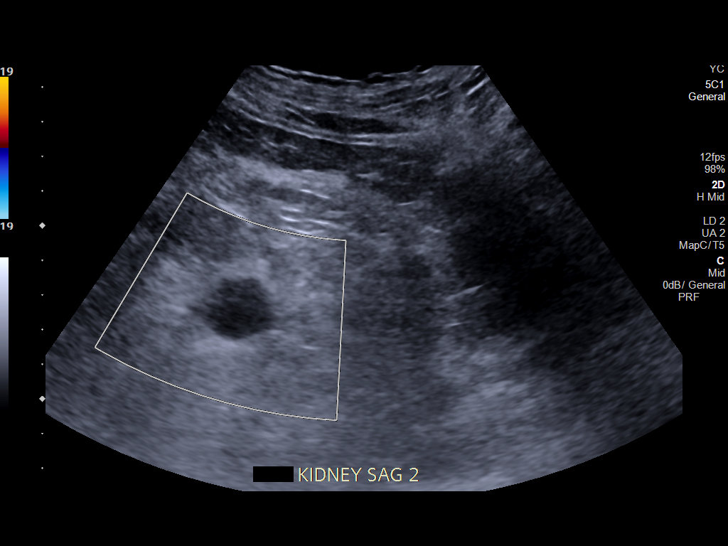
[im 93/102]
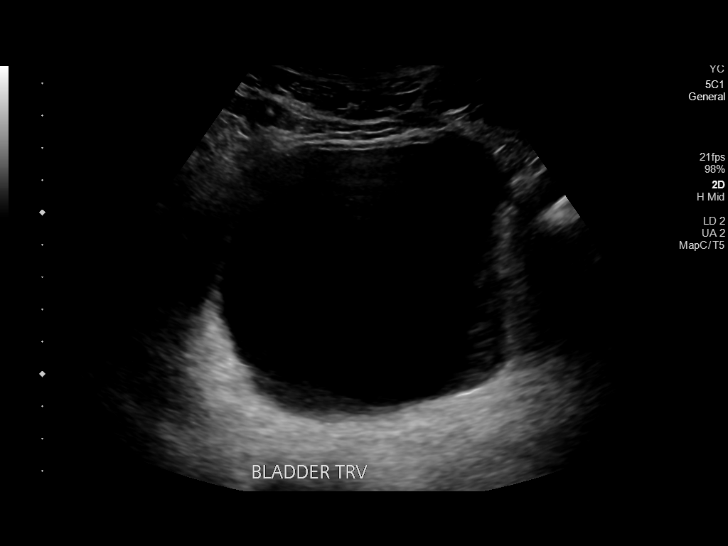
[im 102/102]
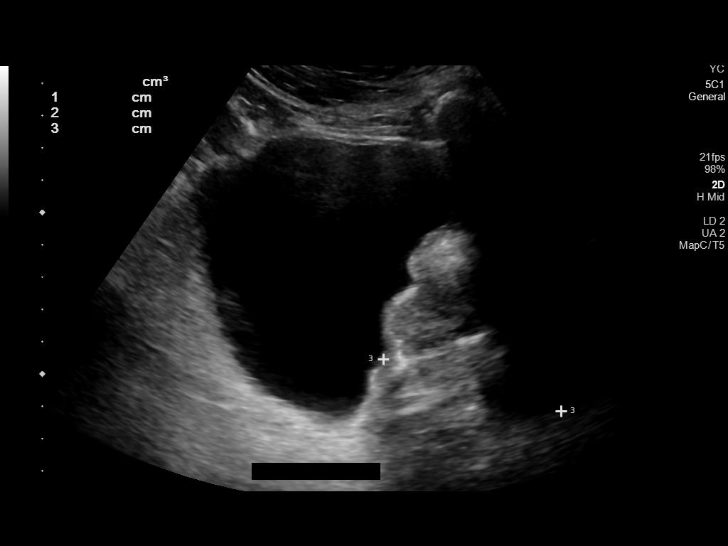

[13 of 25 positions shown; findings below may reference images not displayed]

FINDINGS: Right Kidney:

Renal measurements: 12.4 x 5.8 x 4.9 cm = volume: 186.0 mL. Normal
renal cortical thickness with slight increased echogenicity.

Two right-sided renal cysts are noted. The upper pole lesion
measures 4.0 x 3.3 x 3.5 cm and has a few thin septations. No
worrisome sonographic features are identified.

The second lesion is in the mid pole region and measures 3.2 x 3.2 x
3.1 cm. This has the appearance of a simple cyst.

Third lesion measures 2.1 x 2.1 x 2.5 cm in the lower pole region.
This has the appearance of a simple cyst.

Left Kidney:

Renal measurements: 13.2 x 6.0 x 6.1 cm = volume: 249.7 mL. Normal
renal cortical thickness. Mild diffuse increased echogenicity.

Three cysts are noted. The largest cyst in the lower pole region
measures 2.9 x 2.6 x 2.4 cm. There is also a midpole cyst measuring
2.1 x 2.1 x 2.5 cm. A smaller lower pole cyst measures 1.6 x 1.5 x
1.6 cm.

Bladder:

Appears normal for degree of bladder distention.

Other:

Mildly enlarged prostate gland is noted.
IMPRESSION: 1. Diffuse increased echogenicity of the kidneys suggesting medical
renal disease.
2. Bilateral renal cysts. No worrisome renal lesions or
hydronephrosis.

## 2021-04-10 ENCOUNTER — Other Ambulatory Visit (HOSPITAL_COMMUNITY): Payer: Self-pay | Admitting: Internal Medicine

## 2021-04-10 ENCOUNTER — Other Ambulatory Visit: Payer: Self-pay | Admitting: Registered Nurse

## 2021-04-10 ENCOUNTER — Other Ambulatory Visit: Payer: Self-pay | Admitting: Internal Medicine

## 2021-04-10 DIAGNOSIS — R413 Other amnesia: Secondary | ICD-10-CM

## 2021-04-10 DIAGNOSIS — R410 Disorientation, unspecified: Secondary | ICD-10-CM

## 2021-04-12 ENCOUNTER — Ambulatory Visit (HOSPITAL_COMMUNITY)
Admission: RE | Admit: 2021-04-12 | Discharge: 2021-04-12 | Disposition: A | Discharge status: Home / Self Care | Payer: Medicare Other | Source: Ambulatory Visit | Attending: Internal Medicine | Admitting: Internal Medicine

## 2021-04-12 ENCOUNTER — Encounter (HOSPITAL_COMMUNITY): Payer: Self-pay

## 2021-04-12 NOTE — Progress Notes (Signed)
Pt did not show up for MRI brain appt. ?

## 2021-05-06 ENCOUNTER — Encounter: Payer: Self-pay | Admitting: Neurology

## 2021-05-06 ENCOUNTER — Telehealth: Payer: Self-pay | Admitting: Neurology

## 2021-05-06 ENCOUNTER — Ambulatory Visit (INDEPENDENT_AMBULATORY_CARE_PROVIDER_SITE_OTHER): Payer: Medicare Other | Admitting: Neurology

## 2021-05-06 VITALS — BP 149/83 | HR 61 | Ht 73.0 in | Wt 200.0 lb

## 2021-05-06 DIAGNOSIS — U071 COVID-19: Secondary | ICD-10-CM

## 2021-05-06 DIAGNOSIS — J9601 Acute respiratory failure with hypoxia: Secondary | ICD-10-CM | POA: Diagnosis not present

## 2021-05-06 DIAGNOSIS — R413 Other amnesia: Secondary | ICD-10-CM

## 2021-05-06 DIAGNOSIS — G9349 Other encephalopathy: Secondary | ICD-10-CM

## 2021-05-06 DIAGNOSIS — R4189 Other symptoms and signs involving cognitive functions and awareness: Secondary | ICD-10-CM

## 2021-05-06 DIAGNOSIS — U099 Post covid-19 condition, unspecified: Secondary | ICD-10-CM

## 2021-05-06 DIAGNOSIS — Z789 Other specified health status: Secondary | ICD-10-CM

## 2021-05-06 DIAGNOSIS — R2990 Unspecified symptoms and signs involving the nervous system: Secondary | ICD-10-CM

## 2021-05-06 NOTE — Progress Notes (Deleted)
? ?Established Patient Office Visit ? ?Subjective:  ?Patient ID: Jerry Caldwell., male    DOB: 1946-01-01  Age: 76 y.o. MRN: [id number] ? ?CC:  ?Chief Complaint  ?Patient presents with  ? New Patient (Initial Visit)  ?  Pt alone, rm 10. He has lately been having moments of "black outs" In feb he had a moment where he was working went home and fell asleep at 3:30 pm. When he woke up it was around 7p and he remembered he had to be somewhere but couldn't remember where. 1 mth ago he had another episode where he forgot the date. He did have covid in the past and noticed the memory concerns were worse after covid.  ? Other  ?  Pt has previously been seen here for sleep. He has a cpap set up feb 2016. States he does still use it but doesn't help with sleep.   ? ? ?HPI ?Jerry Caldwell. presents for *** ? ?Past Medical History:  ?Diagnosis Date  ? Allergy   ? 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 Diabetes ([place])   ? Diverticulosis   ? Glaucoma   ? HTN (hypertension)   ? Hyperlipidemia   ? Hypogonadism in male   ? Migraines   ? Sleep apnea   ? uses cpap   ? ? ?Past Surgical History:  ?Procedure Laterality Date  ? [place]  ? COLONOSCOPY  03/31/2006  ? Diverticulosis  ? ROTATOR CUFF REPAIR Right 2008  ? ? ?Family History  ?Problem Relation Age of Onset  ? Glaucoma Mother   ? Cancer - Lung Mother   ? Diabetes type II Maternal Aunt   ? Diabetes Sister   ? Colon cancer Neg Hx   ? Colon polyps Neg Hx   ? Esophageal cancer Neg Hx   ? Rectal cancer Neg Hx   ? Stomach cancer Neg Hx   ? ? ?Social History  ? ?Socioeconomic History  ? Marital status: Widowed  ?  Spouse name: Jerry Caldwell  ? Number of children: 4  ? Years of education: College  ? Highest education level: Not on file  ?Occupational History  ? Not on file  ?Tobacco Use  ? Smoking status: Former  ? Smokeless tobacco: Never  ?Substance and Sexual Activity  ? Alcohol use: No  ? Drug use: No  ? Sexual activity: Not on file  ?Other Topics Concern  ? Not on  file  ?Social History Narrative  ? Patient is married Jerry Caldwell)   ? Patient has four children.  ? Patient has a college degree.  ? Patient is a Special educational needs teacher.  ? 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  ? Patient is right-handed.  ? Patient drinks caffeine, coffee - occasionally and tea-three times per week.  ? ?Social Determinants of Health  ? ?Financial Resource Strain: Not on file  ?Food Insecurity: Not on file  ?Transportation Needs: Not on file  ?Physical Activity: Not on file  ?Stress: Not on file  ?Social Connections: Not on file  ?Intimate Partner Violence: Not on file  ? ? ?Outpatient Medications Prior to Visit  ?Medication Sig Dispense Refill  ? atenolol (TENORMIN) 25 MG tablet Take 25 mg by mouth daily.    ? atorvastatin (LIPITOR) 80 MG tablet Take 80 mg by mouth daily.    ? fluticasone (FLONASE) 50 MCG/ACT nasal spray Place 1 spray into both nostrils daily as needed for allergies  or rhinitis.    ? KRILL OIL PO Take 1 capsule by mouth daily.    ? latanoprost (XALATAN) 0.005 % ophthalmic solution Place 1 drop into both eyes at bedtime.     ? metFORMIN (GLUCOPHAGE) 500 MG tablet Take 500 mg by mouth daily.    ? tamsulosin (FLOMAX) 0.4 MG CAPS capsule Take 0.4 mg by mouth daily.    ? timolol (TIMOPTIC) 0.5 % ophthalmic solution Place 1 drop into both eyes 2 (two) times daily.     ? aspirin 81 MG chewable tablet Chew 81 mg by mouth daily.    ? ?No facility-administered medications prior to visit.  ? ? ?Allergies  ?Allergen Reactions  ? Lisinopril Cough  ? ? ?ROS ?Review of Systems ? ?  ?Objective:  ?  ?Physical Exam ? ?BP (!) 149/83   Pulse 61   Ht '6\' 1"'$  (1.854 m)   Wt 200 lb (90.7 kg)   BMI 26.39 kg/m?  ?Wt Readings from Last 3 Encounters:  ?05/06/21 200 lb (90.7 kg)  ?02/28/19 172 lb 13.5 oz (78.4 kg)  ?06/17/16 214 lb (97.1 kg)  ? ? ? ?Health Maintenance Due  ?Topic Date Due  ? HEMOGLOBIN A1C  Never done  ? Pneumonia Vaccine 20+ Years old (1 - PCV) Never done  ? FOOT EXAM  Never done  ?  OPHTHALMOLOGY EXAM  Never done  ? URINE MICROALBUMIN  Never done  ? Hepatitis C Screening  Never done  ? Zoster Vaccines- Shingrix (1 of 2) Never done  ? COVID-19 Vaccine (5 - Booster for [place] series) 12/12/2020  ? TETANUS/TDAP  12/30/2020  ? ? ?There are no preventive care reminders to display for this patient. ? ?No results found for: TSH ?Lab Results  ?Component Value Date  ? WBC 11.5 (H) 02/26/2019  ? HGB 13.8 02/26/2019  ? HCT 43.3 02/26/2019  ? MCV 83.3 02/26/2019  ? PLT 431 (H) 02/26/2019  ? ?Lab Results  ?Component Value Date  ? NA 144 02/27/2019  ? K 4.7 02/27/2019  ? CO2 21 (L) 02/27/2019  ? GLUCOSE 185 (H) 02/27/2019  ? BUN 28 (H) 02/27/2019  ? CREATININE 1.15 02/27/2019  ? BILITOT 1.0 02/24/2019  ? ALKPHOS 63 02/24/2019  ? AST 36 02/24/2019  ? ALT 30 02/24/2019  ? PROT 6.9 02/24/2019  ? ALBUMIN 2.6 (L) 02/24/2019  ? CALCIUM 9.0 02/27/2019  ? ANIONGAP 9 02/27/2019  ? ?No results found for: CHOL ?No results found for: HDL ?No results found for: [place] ?Lab Results  ?Component Value Date  ? TRIG 203 (H) 02/21/2019  ? ?No results found for: CHOLHDL ?No results found for: HGBA1C ? ?  ?Assessment & Plan:  ? ?Problem List Items Addressed This Visit   ?None ? ? ?No orders of the defined types were placed in this encounter. ? ? ?Follow-up: No follow-ups on file.  ? ? ?Larey Seat, MD ?

## 2021-05-06 NOTE — Progress Notes (Signed)
? ? ? ?Provider:  Larey Seat, M D  ?Referring Provider: Candie Echevaria* ?Primary Care Physician:  Haywood Pao, MD ? ?Chief Complaint  ?Patient presents with  ? New Patient (Initial Visit)  ?  Pt alone, rm 10. He has lately been having moments of "black outs" In feb he had a moment where he was working went home and fell asleep at 3:30 pm. When he woke up it was around 7p and he remembered he had to be somewhere but couldn't remember where. 1 mth ago he had another episode where he forgot the date. He did have covid in the past and noticed the memory concerns were worse after covid.  ? Other  ?  Pt has previously been seen here for sleep. He has a cpap set up feb 2016. States he does still use it but doesn't help with sleep.   ? ? ?HPI:  Jerry Caldwell. is a 76 y.o. male . ? Provider:  Larey Seat, MD  ?Primary Care Physician:  Haywood Pao, MD ?[address] ?[place] [address]  ? ?  ?Referring Provider: Bethena Roys, [place] ?[address] ?[place],  [place] [address]  ?  ?  ?    ?Chief Complaint  ?Patient presents with  ? New Patient (Initial Visit)  ?    Pt alone, rm 10. He has lately been having moments of "black outs" In feb he had a moment where he was working went home and fell asleep at 3:30 pm. When he woke up it was around 7p and he remembered he had to be somewhere but couldn't remember where. 1 mth ago he had another episode where he forgot the date. He did have covid in the past and noticed the memory concerns were worse after covid.   ?  ?Other Pt has previously been seen here for sleep. His cpap set up was February 2016. States he does still use it but doesn't help with sleep. He is not finding it's working as well.  ?Needs a new one.    ?  ?  ?  ?HPI: Jerry Caldwell is a 76 y.o. year old male patient , seen here as in a referral on 05-06-2021  from Dr Osborne Casco and [place] for a Memory Consultation.  ? ? ?Chief concern according to patient :  I have memory trouble,  worsened by Covid 19, and I need a new BiPAP. ?He fell ill January 2021  with Covid 19, lost his wife to the infection when she had a cardiac arrest/ pulmonary failure- died suddenly at home on February 26, 2022- was buried while he was hospitalized.  ?He had pneumonia and hypoxia. He feels he has never fully recovered. He lost taste and smell.  ?He is  a diabetic, HTN,  and has AV block, first degree.  ? ? ? ?In his recollection, his memory became  more and more challenged ever since, and progressively.  ?He is getting lost on familiar roads. Has  trouble remembering names, dates, appointments.  ?He gets headaches now he never had before- center of the forehead and pressure behind the eyes.  ?Not waking up with these, they increase as the day goes on.  He feels this is more sinus related, causing a nasal drip.  ?He reports having no problems with his check book balancing or keeping a budget. He pays bills on time.  ?He drives in daytime, is not feeling comfortable to drive with poor visibility.  ? ?  He can't use the already old BiPAP machine due to mask if his nose is running.  ? ?1996 he quit drinking , and quit smoking, retired from [employer]. ? ?One daughter and one son live in [place] and one son now in [place].  ? ?Mother, maternal aunts and MGM all had Alzheimer's disease, one sister was affected too. ?Insulin dependent diabetes in mother and sister. Sister had ESRD and quit Hemodialysis.  ?  ?Review of Systems: ?Out of a complete 14 system review, the patient complains of only the following symptoms, and all other reviewed systems are negative. 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 Still has taste changes.  ? ?Social History  ? ?Socioeconomic History  ? Marital status: Widowed  ?  Spouse name: [name]  ? Number of children: 4  ? Years of education: College  ? Highest education level: Not on file  ?Occupational History  ? Not on file  ?Tobacco Use  ? Smoking status: Former  ? Smokeless tobacco:  Never  ?Substance and Sexual Activity  ? Alcohol use: No  ? Drug use: No  ? Sexual activity: Not on file  ?Other Topics Concern  ? Not on file  ?Social History Narrative  ? Patient is married [name])   ? Patient has four children.  ? Patient has a college degree.  ? Patient is a Special educational needs teacher.  ? 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  ? Patient is right-handed.  ? Patient drinks caffeine, coffee - occasionally and tea-three times per week.  ? ?Social Determinants of Health  ? ?Financial Resource Strain: Not on file  ?Food Insecurity: Not on file  ?Transportation Needs: Not on file  ?Physical Activity: Not on file  ?Stress: Not on file  ?Social Connections: No Family left in this state, wife passed in January 2021.   ?Intimate Partner Violence: Not on file  ? ? ?Family History  ?Problem Relation Age of Onset  ? Glaucoma Mother   ? Cancer - Lung Mother   ? Diabetes type II Maternal Aunt   ? Diabetes Sister   ? Colon cancer Neg Hx   ? Colon polyps Neg Hx   ? Esophageal cancer Neg Hx   ? Rectal cancer Neg Hx   ? Stomach cancer Neg Hx   ? ? ?Past Medical History:  ?Diagnosis Date  ? Allergy   ? 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 Diabetes ([place])   ? Diverticulosis   ? Glaucoma   ? HTN (hypertension)   ? Hyperlipidemia   ? Hypogonadism in male   ? Migraines   ? Sleep apnea   ? uses cpap   ? ? ?Past Surgical History:  ?Procedure Laterality Date  ? [place]  ? COLONOSCOPY  03/31/2006  ? Diverticulosis  ? ROTATOR CUFF REPAIR Right 2008  ? ? ?Current Outpatient Medications  ?Medication Sig Dispense Refill  ? atenolol (TENORMIN) 25 MG tablet Take 25 mg by mouth daily.    ? atorvastatin (LIPITOR) 80 MG tablet Take 80 mg by mouth daily.    ? fluticasone (FLONASE) 50 MCG/ACT nasal spray Place 1 spray into both nostrils daily as needed for allergies or rhinitis.    ? KRILL OIL PO Take 1 capsule by mouth daily.    ? latanoprost (XALATAN) 0.005 % ophthalmic solution Place 1 drop into both  eyes at bedtime.     ? metFORMIN (GLUCOPHAGE) 500 MG tablet Take  500 mg by mouth daily.    ? tamsulosin (FLOMAX) 0.4 MG CAPS capsule Take 0.4 mg by mouth daily.    ? timolol (TIMOPTIC) 0.5 % ophthalmic solution Place 1 drop into both eyes 2 (two) times daily.     ? ?No current facility-administered medications for this visit.  ? ? ?Allergies as of 05/06/2021 - Review Complete 05/06/2021  ?Allergen Reaction Noted  ? Lisinopril Cough   ? ? ?Vitals: ?BP (!) 149/83   Pulse 61   Ht '6\' 1"'$  (1.854 m)   Wt 200 lb (90.7 kg)   BMI 26.39 kg/m?  ?Last Weight:  ?Wt Readings from Last 1 Encounters:  ?05/06/21 200 lb (90.7 kg)  ? ?Last Height:   ?Ht Readings from Last 1 Encounters:  ?05/06/21 '6\' 1"'$  (1.854 m)  ? ? ?Physical exam: ? ?General: The patient is awake, alert and appears not in acute distress. The patient is well groomed. ?Head: Normocephalic, atraumatic. Neck is supple.  ?Mallampati 3, neck circumference:16.75" ?Cardiovascular:  Regular rate and rhythm, without  murmurs or carotid bruit, and without distended neck veins. ?Respiratory: Lungs are clear to auscultation. ?Skin:  Without evidence of edema, or rash ?Trunk: BMI is 26.3 , normal posture. ? ?Neurologic exam : ?The patient is awake and alert, oriented to place and time.  Memory subjective impaired  described as progressively worsening.  ?There is a normal attention span & concentration ability.  ? ? ?  05/06/2021  ?  9:47 AM  ?MMSE - Mini Mental State Exam  ?Orientation to time 5  ?Orientation to Place 4  ?Registration 3  ?Attention/ Calculation ?WORLD backwards- 5  ?Recall 0  ?Language- name 2 objects 2  ?Language- repeat 1  ?Language- follow 3 step command 3  ?Language- read & follow direction 1  ?Write a sentence 1  ?Copy design 0  ?Total score 25/ 30   ? ?  ? ?  05/06/2021  ?  9:44 AM  ?Montreal Cognitive Assessment   ?Visuospatial/ Executive (0/5) 2  ?Naming (0/3) 3  ?Attention: Read list of digits (0/2) 1  ?Attention: Read list of letters (0/1) 1   ?Attention: Serial 7 subtraction starting at 100 (0/3) 3  ?Language: Repeat phrase (0/2) 0  ?Language : Fluency (0/1) 1  ?Abstraction (0/2) 2  ?Delayed Recall (0/5) 0  ?Orientation (0/6) 6  ?Total 19  ?  ?Speech is fluent w

## 2021-05-06 NOTE — Telephone Encounter (Signed)
[employer] medicare/trcare order sent to [facility], [facility] they will reach out to the patient to schedule.  ?

## 2021-05-06 NOTE — Addendum Note (Signed)
Addended by: Larey Seat on: 05/06/2021 10:58 AM ? ? Modules accepted: Orders ? ?

## 2021-05-06 NOTE — Patient Instructions (Signed)
Memory Compensation Strategies  Use "WARM" strategy.  W= write it down  A= associate it  R= repeat it  M= make a mental note  2.   You can keep a Memory Notebook.  Use a 3-ring notebook with sections for the following: calendar, important names and phone numbers,  medications, doctors' names/phone numbers, lists/reminders, and a section to journal what you did  each day.   3.    Use a calendar to write appointments down.  4.    Write yourself a schedule for the day.  This can be placed on the calendar or in a separate section of the Memory Notebook.  Keeping a  regular schedule can help memory.  5.    Use medication organizer with sections for each day or morning/evening pills.  You may need help loading it  6.    Keep a basket, or pegboard by the door.  Place items that you need to take out with you in the basket or on the pegboard.  You may also want to  include a message board for reminders.  7.    Use sticky notes.  Place sticky notes with reminders in a place where the task is performed.  For example: " turn off the  stove" placed by the stove, "lock the door" placed on the door at eye level, " take your medications" on  the bathroom mirror or by the place where you normally take your medications.  8.    Use alarms/timers.  Use while cooking to remind yourself to check on food or as a reminder to take your medicine, or as a  reminder to make a call, or as a reminder to perform another task, etc. Management of Memory Problems  There are some general things you can do to help manage your memory problems.  Your memory may not in fact recover, but by using techniques and strategies you will be able to manage your memory difficulties better.  1)  Establish a routine. Try to establish and then stick to a regular routine.  By doing this, you will get used to what to expect and you will reduce the need to rely on your memory.  Also, try to do things at the same time of day, such as  taking your medication or checking your calendar first thing in the morning. Think about think that you can do as a part of a regular routine and make a list.  Then enter them into a daily planner to remind you.  This will help you establish a routine.  2)  Organize your environment. Organize your environment so that it is uncluttered.  Decrease visual stimulation.  Place everyday items such as keys or cell phone in the same place every day (ie.  Basket next to front door) Use post it notes with a brief message to yourself (ie. Turn off light, lock the door) Use labels to indicate where things go (ie. Which cupboards are for food, dishes, etc.) Keep a notepad and pen by the telephone to take messages  3)  Memory Aids A diary or journal/notebook/daily planner Making a list (shopping list, chore list, to do list that needs to be done) Using an alarm as a reminder (kitchen timer or cell phone alarm) Using cell phone to store information (Notes, Calendar, Reminders) Calendar/White board placed in a prominent position Post-it notes  In order for memory aids to be useful, you need to have good habits.  It's no good remembering   to make a note in your journal if you don't remember to look in it.  Try setting aside a certain time of day to look in journal.  4)  Improving mood and managing fatigue. There may be other factors that contribute to memory difficulties.  Factors, such as anxiety, depression and tiredness can affect memory. Regular gentle exercise can help improve your mood and give you more energy. Simple relaxation techniques may help relieve symptoms of anxiety Try to get back to completing activities or hobbies you enjoyed doing in the past. Learn to pace yourself through activities to decrease fatigue. Find out about some local support groups where you can share experiences with others. Try and achieve 7-8 hours of sleep at night. 

## 2021-05-07 ENCOUNTER — Telehealth: Payer: Self-pay | Admitting: *Deleted

## 2021-05-07 LAB — DEMENTIA PANEL
Homocysteine: 11.9 umol/L (ref 0.0–19.2)
RPR Ser Ql: NONREACTIVE
TSH: 0.315 u[IU]/mL — ABNORMAL LOW (ref 0.450–4.500)
Vitamin B-12: 1381 pg/mL — ABNORMAL HIGH (ref 232–1245)

## 2021-05-07 NOTE — Progress Notes (Signed)
Hyperactive thyroid ? Share with Dr Osborne Casco.  ? ?Vit B 12 is certainly supplemented at this blood level.

## 2021-05-07 NOTE — Telephone Encounter (Signed)
-----   Message from Larey Seat, MD sent at 05/07/2021  1:07 PM EDT ----- ?Hyperactive thyroid ? Share with Dr Osborne Casco.  ? ?Vit B 12 is certainly supplemented at this blood level.  ?

## 2021-05-07 NOTE — Telephone Encounter (Signed)
Called and spoke w/ pt about lab results. He will f/u with PCP about abnormal TSH level. I sent results to PCP via epic.  ? ?He will be on look out for call to schedule MRI. ?

## 2021-05-08 ENCOUNTER — Telehealth: Payer: Self-pay

## 2021-05-08 NOTE — Telephone Encounter (Signed)
LVM for pt to call me back to schedule sleep study  

## 2021-05-10 ENCOUNTER — Ambulatory Visit (INDEPENDENT_AMBULATORY_CARE_PROVIDER_SITE_OTHER): Payer: Medicare Other | Admitting: Neurology

## 2021-05-10 DIAGNOSIS — J9601 Acute respiratory failure with hypoxia: Secondary | ICD-10-CM

## 2021-05-10 DIAGNOSIS — G9349 Other encephalopathy: Secondary | ICD-10-CM

## 2021-05-10 DIAGNOSIS — Z789 Other specified health status: Secondary | ICD-10-CM

## 2021-05-10 DIAGNOSIS — R2990 Unspecified symptoms and signs involving the nervous system: Secondary | ICD-10-CM

## 2021-05-10 DIAGNOSIS — R4189 Other symptoms and signs involving cognitive functions and awareness: Secondary | ICD-10-CM

## 2021-05-10 DIAGNOSIS — G4733 Obstructive sleep apnea (adult) (pediatric): Secondary | ICD-10-CM | POA: Diagnosis not present

## 2021-05-10 DIAGNOSIS — R413 Other amnesia: Secondary | ICD-10-CM

## 2021-05-13 ENCOUNTER — Ambulatory Visit (INDEPENDENT_AMBULATORY_CARE_PROVIDER_SITE_OTHER): Payer: Medicare Other | Admitting: Neurology

## 2021-05-13 DIAGNOSIS — R413 Other amnesia: Secondary | ICD-10-CM

## 2021-05-13 DIAGNOSIS — R4189 Other symptoms and signs involving cognitive functions and awareness: Secondary | ICD-10-CM

## 2021-05-13 DIAGNOSIS — R410 Disorientation, unspecified: Secondary | ICD-10-CM | POA: Diagnosis not present

## 2021-05-13 DIAGNOSIS — J9601 Acute respiratory failure with hypoxia: Secondary | ICD-10-CM

## 2021-05-17 ENCOUNTER — Ambulatory Visit
Admission: RE | Admit: 2021-05-17 | Discharge: 2021-05-17 | Disposition: A | Discharge status: Home / Self Care | Payer: Medicare Other | Source: Ambulatory Visit | Attending: Neurology | Admitting: Neurology

## 2021-05-17 DIAGNOSIS — U071 COVID-19: Secondary | ICD-10-CM

## 2021-05-17 DIAGNOSIS — R4189 Other symptoms and signs involving cognitive functions and awareness: Secondary | ICD-10-CM | POA: Diagnosis not present

## 2021-05-17 DIAGNOSIS — J9601 Acute respiratory failure with hypoxia: Secondary | ICD-10-CM

## 2021-05-17 DIAGNOSIS — G9349 Other encephalopathy: Secondary | ICD-10-CM

## 2021-05-17 DIAGNOSIS — R2990 Unspecified symptoms and signs involving the nervous system: Secondary | ICD-10-CM

## 2021-05-17 MED ORDER — GADOBENATE DIMEGLUMINE 529 MG/ML IV SOLN
19.0000 mL | Freq: Once | INTRAVENOUS | Status: AC | PRN
  Administered 2021-05-17: 19 mL via INTRAVENOUS

## 2021-05-20 ENCOUNTER — Encounter: Payer: Self-pay | Admitting: Neurology

## 2021-05-20 DIAGNOSIS — G4733 Obstructive sleep apnea (adult) (pediatric): Secondary | ICD-10-CM | POA: Insufficient documentation

## 2021-05-20 DIAGNOSIS — R413 Other amnesia: Secondary | ICD-10-CM | POA: Insufficient documentation

## 2021-05-20 NOTE — Procedures (Signed)
PATIENT'S NAME:  Jerry Caldwell, Jerry Caldwell ?DOB:      29-Sep-1945      ?MR#:    [id number]     ?DATE OF RECORDING: 05/10/2021 ?REFERRING M.D.:  Jerry Caldwell, [place], MD ?Study Performed:  Split-Night Titration Study ?HISTORY:  Jerry Caldwell. is a 76 y.o. male. He has lately been having moments of "black outs" He after he was working, he went home one day and fell asleep at 3:30 pm. When he woke up it was around 7pm and he remembered he had to be somewhere but couldn't remember where. 1 month ago, he had another episode where he forgot the date. He did have covid in the recent past and noticed the memory concerns were worse after covid. He has previously been seen here for sleep. His CPAP set up was February 2016. States he does still use CPAP but doesn't help much with sleepiness anymore. He needs a new one. ?The patient endorsed the Epworth Sleepiness Scale at 16/24 points   ?The patient's weight 200 pounds with a height of 73 (inches), resulting in a BMI of 26.6 kg/m2. ?The patient's neck circumference measured 16.8 inches. ? ?CURRENT MEDICATIONS: Tenormin, Lipitor, Flonase, Krill oil, Xalatan, Glucophage, Flomax, Timoptic  ?PROCEDURE:  This is a multichannel digital polysomnogram utilizing the Somnostar 11.2 system.  Electrodes and sensors were applied and monitored per AASM Specifications.   EEG, EOG, Chin and Limb EMG, were sampled at 200 Hz.  ECG, Snore and Nasal Pressure, Thermal Airflow, Respiratory Effort, CPAP Flow and Pressure, Oximetry was sampled at 50 Hz. Digital video and audio were recorded.     ? ?BASELINE STUDY WITHOUT CPAP RESULTS: Lights Out was at 21:54 and Lights On at 05:00.  Total recording time (TRT) was 168, with a total sleep time (TST) of 146 minutes.   The patient's sleep latency was 22.5 minutes.  REM latency was 56.5 minutes.  The sleep efficiency was 86.9 %.  ?  ?SLEEP ARCHITECTURE: WASO (Wake after sleep onset) was 4 minutes, Stage N1 was 10.5 minutes, Stage N2 was 99.5 minutes,  Stage N3 was 0 minutes and Stage R (REM sleep) was 36 minutes.  The percentages were Stage N1 7.2%, Stage N2 68.2%, Stage N3 0% and Stage R (REM sleep) 24.7%.  ? ?RESPIRATORY ANALYSIS:  There were a total of 52 respiratory events:  47 obstructive apneas, 2 central apneas and 0 mixed apneas with a total of 49 apneas and an apnea index (AI) of 20.1. There were 3 hypopneas with a hypopnea index of 1.2.  ?The total APNEA/HYPOPNEA INDEX (AHI) was 21.4 /hour.  25 events occurred in REM sleep and 5 events in NREM. The REM AHI was 41.7, /hour versus a non-REM AHI of 14.7 /hour. The patient spent 301.5 minutes sleep time in the supine position 91 minutes in non-supine. The supine AHI was 25.2 /hour versus a non-supine AHI of 0.0 /hour. ? ?OXYGEN SATURATION & C02:  The wake baseline 02 saturation was 98%, with the lowest being 82%. Time spent below 89% saturation equaled 9 minutes. The arousals were noted as: 27 were spontaneous, 0 were associated with PLMs, 22 were associated with respiratory events. ?The patient had a total of 0 Periodic Limb Movements.  Snoring was noted. EKG was in keeping with normal sinus rhythm (NSR) ? ?TITRATION STUDY WITH CPAP RESULTS: CPAP was initiated at 5 cmH20 with heated humidity per AASM split night standards and pressure was advanced to 9cmH20 because of hypopneas, apneas and desaturations.  At  a PAP pressure of 9 cmH20, there was a reduction of the AHI to 0.0 /hour.  Total recording time (TRT) was 258.5 minutes, with a total sleep time (TST) of 246.5 minutes. The patient's sleep latency was 17 minutes. REM latency was 104.5 minutes.  The sleep efficiency was 95.4 %.   ? ?SLEEP ARCHITECTURE: Wake after sleep was 11.5 minutes, Stage N1 25 minutes, Stage N2 192.5 minutes, Stage N3 0 minutes and Stage R (REM sleep) 29 minutes. The percentages were: Stage N1 10.1%, Stage N2 78.1%, Stage N3 0% and Stage R (REM sleep) 11.8%. The sleep architecture was notable for PLM activity breakthrough after AHI  was reduced. ?The arousals were noted as: 27 were spontaneous, 0 were associated with PLMs, 3 were associated with respiratory events. ? ?RESPIRATORY ANALYSIS:  There were a total of 20 respiratory events: 12 obstructive apneas, 0 central apneas and 1 mixed apneas with a total of 13 apneas and an apnea index (AI) of 3.2. There were 7 hypopneas with a hypopnea index of 1.7 /hour.  ?The total APNEA/HYPOPNEA INDEX (AHI) was 4.9 /hour.  3 events occurred in REM sleep and 17 events in NREM. The REM AHI was 6.2 /hour versus a non-REM AHI of 4.7 /hour. The patient spent 72% of total sleep time in the supine position. The supine AHI was 5.8 /hour, versus a non-supine AHI of 2.6/hour. ? ?OXYGEN SATURATION & C02:  The wake baseline 02 saturation was 96%, with the lowest being 90%. Time spent below 89% saturation equaled 0 minutes. ? ?PERIODIC LIMB MOVEMENTS:    ?The patient had a total of 10 Periodic Limb Movements. The Periodic Limb Movement (PLM) index was 2.4 /hour and the PLM Arousal index was 0 /hour. ?The patient was fitted with medium Eson 2 nasal mask ? ?POLYSOMNOGRAPHY IMPRESSION :  ? ?Moderate degree of Obstructive Sleep Apnea (OSA) was present and exacerbated by supine sleep position and REM sleep. This apnea responded to CPAP at 9 cm water pressure. There was no hypoxia present under apnea.  ? ?RECOMMENDATIONS: CPAP-auto by [employer] will be ordered for a range from 6 cm to 12 cm water pressure with 1 cm EPR and mask of patient's choice, patient's choice of humidity setting. ?A follow up appointment will be scheduled in the Sleep Clinic at [facility].    ? ?I certify that I have reviewed the entire raw data recording prior to the issuance of this report in accordance with the Standards of Accreditation of the [place] [employer] (AASM) ? ? ?Jerry Caldwell, M.D. ?Diplomat, Tax adviser of Psychiatry and Neurology  ?Diplomat, Tax adviser of Sleep Medicine ?Medical Director,  [facility] at [facility] ? ?

## 2021-05-20 NOTE — Addendum Note (Signed)
Addended by: Larey Seat on: 05/20/2021 05:04 PM ? ? Modules accepted: Orders ? ?

## 2021-05-20 NOTE — Procedures (Signed)
? ? ?  History: ? ?76 year old man with episodic memory loss  ? ?EEG classification: Awake and drowsy ? ?Description of the recording: The background rhythms of this recording consists of a fairly well modulated medium amplitude alpha rhythm of 8.5 Hz that is reactive to eye opening and closure. As the record progresses, the patient appears to remain in the waking state throughout the recording. Photic stimulation was performed, did not show any abnormalities. Hyperventilation was also performed, did not show any abnormalities. Toward the end of the recording, the patient enters the drowsy state with slight symmetric slowing seen. The patient never enters stage II sleep. No abnormal epileptiform discharges seen during this recording. There was no focal slowing. EKG monitor shows no evidence of cardiac rhythm abnormalities with a heart rate of 60. ? ?Abnormality: None  ? ?Impression: This is a normal EEG recording in the waking and drowsy state. No evidence of interictal epileptiform discharges seen. A normal EEG does not exclude a diagnosis of epilepsy.  ? ? ?Alric Ran, MD ?[facility] ?  ?

## 2021-05-21 ENCOUNTER — Telehealth: Payer: Self-pay | Admitting: *Deleted

## 2021-05-21 NOTE — Telephone Encounter (Signed)
Pt called in to discuss SSR and I was able to review the results from the sleep study as well as the MRI. Pt verbalized understanding. ? ?

## 2021-05-21 NOTE — Telephone Encounter (Signed)
-----   Message from Larey Seat, MD sent at 05/20/2021  5:03 PM EDT ----- ?The patient was fitted with medium Eson 2 nasal mask ?? ?POLYSOMNOGRAPHY IMPRESSION?:  ?? ?1. Moderate degree of Obstructive Sleep Apnea (OSA) was present and exacerbated by supine sleep position and REM sleep. This apnea responded to CPAP at 9 cm water pressure. There was no hypoxia present under apnea.  ?? ?RECOMMENDATIONS: CPAP-auto by [employer] will be ordered for a range from 6 cm to 12 cm water pressure with 1 cm EPR and mask of patient's choice, patient's choice of humidity setting. ?A follow up appointment will be scheduled in the Sleep Clinic at [facility].    ?? ?

## 2021-05-21 NOTE — Telephone Encounter (Signed)
I called pt. I advised pt that Dr. Brett Fairy reviewed their sleep study results and found that pt has OSA. Dr. Brett Fairy recommends that pt starts auto CPAP. I reviewed PAP compliance expectations with the pt. Pt is agreeable to starting a CPAP. I advised pt that an order will be sent to a DME, [facility], and [facility] will call the pt within about one week after they file with the pt's insurance. [facility] will show the pt how to use the machine, fit for masks, and troubleshoot the CPAP if needed. A follow up appt was made for insurance purposes with Hedwig Morton, NP on 08/06/2021 at 11:30 am. Pt verbalized understanding to arrive 15 minutes early and bring their CPAP. A letter with all of this information in it will be mailed to the pt as a reminder. I verified with the pt that the address we have on file is correct. Pt verbalized understanding of results. Pt had no questions at this time but was encouraged to call back if questions arise. I have sent the order to [facility] and have received confirmation that they have received the order. ? ?

## 2021-05-21 NOTE — Telephone Encounter (Signed)
LVM for pt to call about sleep study results and MRI results.  ?

## 2021-05-22 ENCOUNTER — Encounter: Payer: Self-pay | Admitting: Neurology

## 2021-05-23 NOTE — Telephone Encounter (Signed)
Called and spoke w/ pt. Relayed EEG results per Dr. Edwena Felty note. He will continue to monitor and call if he has any new or worsening sx. ?

## 2021-08-05 NOTE — Progress Notes (Unsigned)
PATIENT: Jerry Caldwell. DOB: 02/01/1945  REASON FOR VISIT: follow up HISTORY FROM: patient PRIMARY NEUROLOGIST: Dr. Brett Fairy  Chief Complaint  Patient presents with   Follow-up    Pt in 4  pt is here for CPAP follow up  Pt states he is still fatigue,snores.  Pt states he had a episode of memory loss      HISTORY OF PRESENT ILLNESS: Today 08/05/21:  Jerry Caldwell is a 76 year old male with a history of OSA on CPAP. He returns today for follow-up. Reports that some nights he forget to put it on and some nights he takes it off during the night. Still feeling fatigued during the day. DL is below.     REVIEW OF SYSTEMS: Out of a complete 14 system review of symptoms, the patient complains only of the following symptoms, and all other reviewed systems are negative.   ESS 16  ALLERGIES: Allergies  Allergen Reactions   Lisinopril Cough    HOME MEDICATIONS: Outpatient Medications Prior to Visit  Medication Sig Dispense Refill   atenolol (TENORMIN) 25 MG tablet Take 25 mg by mouth daily.     atorvastatin (LIPITOR) 80 MG tablet Take 80 mg by mouth daily.     fluticasone (FLONASE) 50 MCG/ACT nasal spray Place 1 spray into both nostrils daily as needed for allergies or rhinitis.     KRILL OIL PO Take 1 capsule by mouth daily.     latanoprost (XALATAN) 0.005 % ophthalmic solution Place 1 drop into both eyes at bedtime.      metFORMIN (GLUCOPHAGE) 500 MG tablet Take 500 mg by mouth daily.     tamsulosin (FLOMAX) 0.4 MG CAPS capsule Take 0.4 mg by mouth daily.     timolol (TIMOPTIC) 0.5 % ophthalmic solution Place 1 drop into both eyes 2 (two) times daily.      No facility-administered medications prior to visit.    PAST MEDICAL HISTORY: Past Medical History:  Diagnosis Date   Allergy    Arthritis    right knee    Diabetes ([place])    Diverticulosis    Glaucoma    HTN (hypertension)    Hyperlipidemia    Hypogonadism in male    Migraines    Sleep apnea    uses cpap      PAST SURGICAL HISTORY: Past Surgical History:  Procedure Laterality Date   ANTERIOR CRUCIATE LIGAMENT REPAIR  1992   COLONOSCOPY  03/31/2006   Diverticulosis   ROTATOR CUFF REPAIR Right 2008    FAMILY HISTORY: Family History  Problem Relation Age of Onset   Glaucoma Mother    Cancer - Lung Mother    Diabetes type II Maternal Aunt    Diabetes Sister    Colon cancer Neg Hx    Colon polyps Neg Hx    Esophageal cancer Neg Hx    Rectal cancer Neg Hx    Stomach cancer Neg Hx     SOCIAL HISTORY: Social History   Socioeconomic History   Marital status: Widowed    Spouse name: [name]   Number of children: 4   Years of education: College   Highest education level: Not on file  Occupational History   Not on file  Tobacco Use   Smoking status: Former   Smokeless tobacco: Never  Substance and Sexual Activity   Alcohol use: No   Drug use: No   Sexual activity: Not on file  Other Topics Concern   Not on file  Social History Narrative   Patient is married [name])    Patient has four children.   Patient has a college degree.   Patient is a Special educational needs teacher.   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   Patient is right-handed.   Patient drinks caffeine, coffee - occasionally and tea-three times per week.   Social Determinants of Health   Financial Resource Strain: Not on file  Food Insecurity: Not on file  Transportation Needs: Not on file  Physical Activity: Not on file  Stress: Not on file  Social Connections: Not on file  Intimate Partner Violence: Not on file      PHYSICAL EXAM  Vitals:   08/06/21 1145  BP: 118/77  Pulse: 71  Weight: 204 lb (92.5 kg)  Height: '6\' 1"'$  (1.854 m)   Body mass index is 26.91 kg/m.  Generalized: Well developed, in no acute distress  Chest: Lungs clear to auscultation bilaterally  Neurological examination  Mentation: Alert oriented to time, place, history taking. Follows all commands speech and language  fluent Cranial nerve II-XII: Extraocular movements were full, visual field were full on confrontational test Head turning and shoulder shrug  were normal and symmetric. Motor: The motor testing reveals 5 over 5 strength of all 4 extremities. Good symmetric motor tone is noted throughout.  Sensory: Sensory testing is intact to soft touch on all 4 extremities. No evidence of extinction is noted.  Gait and station: Gait is normal.    DIAGNOSTIC DATA (LABS, IMAGING, TESTING) - I reviewed patient records, labs, notes, testing and imaging myself where available.  Lab Results  Component Value Date   WBC 11.5 (H) 02/26/2019   HGB 13.8 02/26/2019   HCT 43.3 02/26/2019   MCV 83.3 02/26/2019   PLT 431 (H) 02/26/2019      Component Value Date/Time   NA 144 02/27/2019 0400   K 4.7 02/27/2019 0400   CL 114 (H) 02/27/2019 0400   CO2 21 (L) 02/27/2019 0400   GLUCOSE 185 (H) 02/27/2019 0400   BUN 28 (H) 02/27/2019 0400   CREATININE 1.15 02/27/2019 0400   CALCIUM 9.0 02/27/2019 0400   PROT 6.9 02/24/2019 0229   ALBUMIN 2.6 (L) 02/24/2019 0229   AST 36 02/24/2019 0229   ALT 30 02/24/2019 0229   ALKPHOS 63 02/24/2019 0229   BILITOT 1.0 02/24/2019 0229   GFRNONAA >60 02/27/2019 0400   GFRAA >60 02/27/2019 0400   Lab Results  Component Value Date   TRIG 203 (H) 02/21/2019   No results found for: "HGBA1C" Lab Results  Component Value Date   VITAMINB12 1,381 (H) 05/06/2021   Lab Results  Component Value Date   TSH 0.315 (L) 05/06/2021      ASSESSMENT AND PLAN 76 y.o. year old male  has a past medical history of Allergy, Arthritis, Diabetes ([place]), Diverticulosis, Glaucoma, HTN (hypertension), Hyperlipidemia, Hypogonadism in male, Migraines, and Sleep apnea. here with:  OSA on CPAP  - CPAP compliance sub optimal  - Good treatment of AHI when using the machine - Encourage patient to use CPAP nightly and > 4 hours each night - F/U in 1 year or sooner if needed   Ward Givens,  MSN, NP-C 08/05/2021, 9:29 AM [facility] [address], [place], [place] [address] [phone]

## 2021-08-06 ENCOUNTER — Ambulatory Visit (INDEPENDENT_AMBULATORY_CARE_PROVIDER_SITE_OTHER): Payer: Medicare Other | Admitting: Adult Health

## 2021-08-06 ENCOUNTER — Encounter: Payer: Self-pay | Admitting: Adult Health

## 2021-08-06 VITALS — BP 118/77 | HR 71 | Ht 73.0 in | Wt 204.0 lb

## 2021-08-06 DIAGNOSIS — G4733 Obstructive sleep apnea (adult) (pediatric): Secondary | ICD-10-CM

## 2021-08-06 DIAGNOSIS — Z9989 Dependence on other enabling machines and devices: Secondary | ICD-10-CM

## 2021-11-06 NOTE — Progress Notes (Unsigned)
PATIENT: Jerry Caldwell. DOB: 10/05/1945  REASON FOR VISIT: follow up HISTORY FROM: patient PRIMARY NEUROLOGIST: Dr. Vickey Huger  HISTORY OF PRESENT ILLNESS: Today 11/07/21:  Jerry Caldwell is a 76 year old male with a history of obstructive sleep apnea on CPAP.  He returns today for follow-up.  Unfortunately we were unable to get a download today.  He does state that he tries to use the machine nightly.  However he does not feel that it is working correctly.  He would like a new machine if possible.  HISTORY  08/05/21:   Jerry Caldwell is a 76 year old male with a history of OSA on CPAP. He returns today for follow-up. Reports that some nights he forget to put it on and some nights he takes it off during the night. Still feeling fatigued during the Caldwell. DL is below.        REVIEW OF SYSTEMS: Out of a complete 14 system review of symptoms, the patient complains only of the following symptoms, and all other reviewed systems are negative.  FSS ESS  ALLERGIES: Allergies  Allergen Reactions   Lisinopril Cough    HOME MEDICATIONS: Outpatient Medications Prior to Visit  Medication Sig Dispense Refill   ALLERGY RELIEF 180 MG tablet Take 180 mg by mouth daily.     atenolol (TENORMIN) 25 MG tablet Take 25 mg by mouth daily.     atorvastatin (LIPITOR) 80 MG tablet Take 80 mg by mouth daily.     fluticasone (FLONASE) 50 MCG/ACT nasal spray Place 1 spray into both nostrils daily as needed for allergies or rhinitis.     latanoprost (XALATAN) 0.005 % ophthalmic solution Place 1 drop into both eyes at bedtime.      metFORMIN (GLUCOPHAGE) 500 MG tablet Take 500 mg by mouth daily.     timolol (TIMOPTIC) 0.5 % ophthalmic solution Place 1 drop into both eyes 2 (two) times daily.      KRILL OIL PO Take 1 capsule by mouth daily. (Patient not taking: Reported on 11/07/2021)     tamsulosin (FLOMAX) 0.4 MG CAPS capsule Take 0.4 mg by mouth daily.     No facility-administered medications prior to  visit.    PAST MEDICAL HISTORY: Past Medical History:  Diagnosis Date   Allergy    Arthritis    right knee    Diabetes (H[facility])    Diverticulosis    Glaucoma    HTN (hypertension)    Hyperlipidemia    Hypogonadism in male    Migraines    Sleep apnea    uses cpap     PAST SURGICAL HISTORY: Past Surgical History:  Procedure Laterality Date   ANTERIOR CRUCIATE LIGAMENT REPAIR  1992   COLONOSCOPY  03/31/2006   Diverticulosis   ROTATOR CUFF REPAIR Right 2008    FAMILY HISTORY: Family History  Problem Relation Age of Onset   Glaucoma Mother    Cancer - Lung Mother    Diabetes Sister    Diabetes type II Maternal Aunt    Colon cancer Neg Hx    Colon polyps Neg Hx    Esophageal cancer Neg Hx    Rectal cancer Neg Hx    Stomach cancer Neg Hx    Sleep apnea Neg Hx     SOCIAL HISTORY: Social History   Socioeconomic History   Marital status: Widowed    Spouse name: Bonita Quin   Number of children: 4   Years of education: College   Highest education level: Not on  file  Occupational History   Not on file  Tobacco Use   Smoking status: Former   Smokeless tobacco: Never  Substance and Sexual Activity   Alcohol use: No   Drug use: No   Sexual activity: Not on file  Other Topics Concern   Not on file  Social History Narrative   Patient is married Bonita Quin) Patient has four children.Patient has a college degree.Patient is a Doctor, 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      Update 11/07/2021   Patient lost his wife 3 years ago    Social Determinants of Corporate investment banker Strain: Not on file  Food Insecurity: Not on file  Transportation Needs: Not on file  Physical Activity: Not on file  Stress: Not on file  Social Connections: Not on file  Intimate Partner Violence: Not on file      PHYSICAL EXAM  Vitals:   11/07/21 1140  BP:  120/75  Pulse: (!) 58  Weight: 204 lb (92.5 kg)  Height: 6\' 1"  (1.854 m)   Body mass index is 26.91 kg/m.  Generalized: Well developed, in no acute distress  Chest: Lungs clear to auscultation bilaterally  Neurological examination  Mentation: Alert oriented to time, place, history taking. Follows all commands speech and language fluent Cranial nerve II-XII: Extraocular movements were full, visual field were full on confrontational test Head turning and shoulder shrug  were normal and symmetric. Gait and station: Gait is normal.    DIAGNOSTIC DATA (LABS, IMAGING, TESTING) - I reviewed patient records, labs, notes, testing and imaging myself where available.  Lab Results  Component Value Date   WBC 11.5 (H) 02/26/2019   HGB 13.8 02/26/2019   HCT 43.3 02/26/2019   MCV 83.3 02/26/2019   PLT 431 (H) 02/26/2019      Component Value Date/Time   NA 144 02/27/2019 0400   K 4.7 02/27/2019 0400   CL 114 (H) 02/27/2019 0400   CO2 21 (L) 02/27/2019 0400   GLUCOSE 185 (H) 02/27/2019 0400   BUN 28 (H) 02/27/2019 0400   CREATININE 1.15 02/27/2019 0400   CALCIUM 9.0 02/27/2019 0400   PROT 6.9 02/24/2019 0229   ALBUMIN 2.6 (L) 02/24/2019 0229   AST 36 02/24/2019 0229   ALT 30 02/24/2019 0229   ALKPHOS 63 02/24/2019 0229   BILITOT 1.0 02/24/2019 0229   GFRNONAA >60 02/27/2019 0400   GFRAA >60 02/27/2019 0400   Lab Results  Component Value Date   TRIG 203 (H) 02/21/2019   No results found for: "HGBA1C" Lab Results  Component Value Date   VITAMINB12 1,381 (H) 05/06/2021   Lab Results  Component Value Date   TSH 0.315 (L) 05/06/2021      ASSESSMENT AND PLAN 76 y.o. year old male  has a past medical history of Allergy, Arthritis, Diabetes (HCC), Diverticulosis, Glaucoma, HTN (hypertension), Hyperlipidemia, Hypogonadism in male, Migraines, and Sleep apnea. here with:  OSA on CPAP  -Unable to get a recent download today -The patient's current machine is from 2016 we will  send an order for new machine had split-night study done this year - Encourage patient to use CPAP nightly and > 4 hours each night - F/U in 1 year or sooner if needed    Butch Penny, MSN, NP-C 11/07/2021, 2:58 PM [facility] [address], [address] [place], [place] [address] [phone]

## 2021-11-07 ENCOUNTER — Encounter: Payer: Self-pay | Admitting: Adult Health

## 2021-11-07 ENCOUNTER — Ambulatory Visit (INDEPENDENT_AMBULATORY_CARE_PROVIDER_SITE_OTHER): Payer: Medicare Other | Admitting: Adult Health

## 2021-11-07 VITALS — BP 120/75 | HR 58 | Ht 73.0 in | Wt 204.0 lb

## 2021-11-07 DIAGNOSIS — G4733 Obstructive sleep apnea (adult) (pediatric): Secondary | ICD-10-CM | POA: Diagnosis not present

## 2021-11-07 NOTE — Progress Notes (Addendum)
Cpap order for new machine and mask of choice sent to [facility]. [facility] confirmed receipt of order.

## 2022-05-06 ENCOUNTER — Emergency Department (HOSPITAL_COMMUNITY): Payer: Medicare Other

## 2022-05-06 ENCOUNTER — Encounter (HOSPITAL_COMMUNITY): Payer: Self-pay

## 2022-05-06 ENCOUNTER — Other Ambulatory Visit: Payer: Self-pay

## 2022-05-06 ENCOUNTER — Emergency Department (HOSPITAL_COMMUNITY)
Admission: EM | Admit: 2022-05-06 | Discharge: 2022-05-07 | Disposition: A | Discharge status: Home / Self Care | Payer: Medicare Other | Attending: Emergency Medicine | Admitting: Emergency Medicine

## 2022-05-06 DIAGNOSIS — F29 Unspecified psychosis not due to a substance or known physiological condition: Secondary | ICD-10-CM | POA: Diagnosis not present

## 2022-05-06 DIAGNOSIS — E119 Type 2 diabetes mellitus without complications: Secondary | ICD-10-CM | POA: Insufficient documentation

## 2022-05-06 DIAGNOSIS — I10 Essential (primary) hypertension: Secondary | ICD-10-CM | POA: Insufficient documentation

## 2022-05-06 DIAGNOSIS — R441 Visual hallucinations: Secondary | ICD-10-CM | POA: Diagnosis present

## 2022-05-06 DIAGNOSIS — Z7984 Long term (current) use of oral hypoglycemic drugs: Secondary | ICD-10-CM | POA: Insufficient documentation

## 2022-05-06 DIAGNOSIS — Z79899 Other long term (current) drug therapy: Secondary | ICD-10-CM | POA: Diagnosis not present

## 2022-05-06 LAB — BASIC METABOLIC PANEL
Anion gap: 7 (ref 5–15)
BUN: 9 mg/dL (ref 8–23)
CO2: 24 mmol/L (ref 22–32)
Calcium: 9.2 mg/dL (ref 8.9–10.3)
Chloride: 109 mmol/L (ref 98–111)
Creatinine, Ser: 1.17 mg/dL (ref 0.61–1.24)
GFR, Estimated: >60 mL/min (ref >60)
Glucose, Bld: 107 mg/dL — ABNORMAL HIGH (ref 70–99)
Potassium: 3.3 mmol/L — ABNORMAL LOW (ref 3.5–5.1)
Sodium: 140 mmol/L (ref 135–145)

## 2022-05-06 LAB — CBC
HCT: 42 % (ref 39.0–52.0)
Hemoglobin: 13.3 g/dL (ref 13.0–17.0)
MCH: 26.2 pg (ref 26.0–34.0)
MCHC: 31.7 g/dL (ref 30.0–36.0)
MCV: 82.8 fL (ref 80.0–100.0)
Platelets: 245 10*3/uL (ref 150–400)
RBC: 5.07 MIL/uL (ref 4.22–5.81)
RDW: 15 % (ref 11.5–15.5)
WBC: 7 10*3/uL (ref 4.0–10.5)
nRBC: 0 % (ref 0.0–0.2)

## 2022-05-06 LAB — ACETAMINOPHEN LEVEL: Acetaminophen (Tylenol), Serum: <10 ug/mL — ABNORMAL LOW (ref 10–30)

## 2022-05-06 LAB — ETHANOL: Alcohol, Ethyl (B): <10 mg/dL (ref <10)

## 2022-05-06 LAB — SALICYLATE LEVEL: Salicylate Lvl: <7 mg/dL — ABNORMAL LOW (ref 7.0–30.0)

## 2022-05-06 MED ORDER — HYDROCHLOROTHIAZIDE 12.5 MG PO TABS
12.5000 mg | ORAL_TABLET | Freq: Every day | ORAL | Status: DC
  Administered 2022-05-06: 12.5 mg via ORAL

## 2022-05-06 MED ORDER — TAMSULOSIN HCL 0.4 MG PO CAPS
ORAL_CAPSULE | ORAL | Status: AC
  Filled 2022-05-06: qty 1

## 2022-05-06 MED ORDER — POTASSIUM CHLORIDE CRYS ER 20 MEQ PO TBCR
EXTENDED_RELEASE_TABLET | ORAL | Status: AC
  Filled 2022-05-06: qty 2

## 2022-05-06 MED ORDER — ATENOLOL 50 MG PO TABS
ORAL_TABLET | ORAL | Status: AC
  Filled 2022-05-06: qty 1

## 2022-05-06 MED ORDER — METFORMIN HCL 500 MG PO TABS: 500.0000 mg | ORAL_TABLET | Freq: Every day | ORAL | Status: DC

## 2022-05-06 MED ORDER — POTASSIUM CHLORIDE CRYS ER 20 MEQ PO TBCR
40.0000 meq | EXTENDED_RELEASE_TABLET | Freq: Once | ORAL | Status: AC
  Administered 2022-05-06: 40 meq via ORAL

## 2022-05-06 MED ORDER — LOSARTAN POTASSIUM 25 MG PO TABS
ORAL_TABLET | ORAL | Status: AC
  Filled 2022-05-06: qty 2

## 2022-05-06 MED ORDER — ATENOLOL 50 MG PO TABS: 50.0000 mg | ORAL_TABLET | Freq: Every day | ORAL | Status: DC

## 2022-05-06 MED ORDER — LOSARTAN POTASSIUM 25 MG PO TABS
50.0000 mg | ORAL_TABLET | Freq: Every day | ORAL | Status: DC
  Administered 2022-05-06: 50 mg via ORAL

## 2022-05-06 MED ORDER — FINASTERIDE 5 MG PO TABS
ORAL_TABLET | ORAL | Status: AC
  Filled 2022-05-06: qty 1

## 2022-05-06 MED ORDER — ATORVASTATIN CALCIUM 40 MG PO TABS
80.0000 mg | ORAL_TABLET | Freq: Every day | ORAL | Status: DC
  Administered 2022-05-06: 80 mg via ORAL

## 2022-05-06 MED ORDER — FINASTERIDE 5 MG PO TABS
5.0000 mg | ORAL_TABLET | Freq: Every day | ORAL | Status: DC
  Administered 2022-05-06: 5 mg via ORAL

## 2022-05-06 MED ORDER — TAMSULOSIN HCL 0.4 MG PO CAPS
0.4000 mg | ORAL_CAPSULE | Freq: Every day | ORAL | Status: DC
  Administered 2022-05-06: 0.4 mg via ORAL

## 2022-05-06 MED ORDER — LATANOPROST 0.005 % OP SOLN
1.0000 [drp] | Freq: Every day | OPHTHALMIC | Status: DC
  Filled 2022-05-06: qty 2.5

## 2022-05-06 MED ORDER — ATORVASTATIN CALCIUM 40 MG PO TABS
ORAL_TABLET | ORAL | Status: AC
  Filled 2022-05-06: qty 2

## 2022-05-06 MED ORDER — HYDROCHLOROTHIAZIDE 12.5 MG PO TABS
ORAL_TABLET | ORAL | Status: AC
  Filled 2022-05-06: qty 1

## 2022-05-06 NOTE — Discharge Instructions (Signed)
You were seen in the emergency department for your hallucinations.  Your workup showed no abnormalities within your brain and your blood work was normal.  You were seen by psychiatry and they recommended that you follow-up with neurology for further workup and evaluation for your hallucinations.  I have given you a referral and if you have not heard from them in the next 2 to 3 days, you should call to schedule an appointment.  You can follow-up with your primary doctor as well in the meantime to have your symptoms rechecked.  You should return to the emergency department if voices are telling you to hurt yourself or others or you are having any thoughts of wanting to hurt yourself or others or if you have any other new or concerning symptoms.

## 2022-05-06 NOTE — ED Notes (Signed)
Patient has IVC. Needs First Exam before 05/07/22, 

## 2022-05-06 NOTE — ED Notes (Signed)
Per [employer], if pt is d/c'd home then they will provide him a ride back home.

## 2022-05-06 NOTE — ED Provider Notes (Addendum)
EMERGENCY DEPARTMENT AT [facility] Provider Note   CSN: [id number] Arrival date & time: 05/06/22  1825     History  Chief Complaint  Patient presents with   IVC    Jerry Caldwell. is a 77 y.o. male.  Patient is a 77 year old male with a past medical history of diabetes and hypertension presenting to the emergency department with hallucinations.  The patient states for the last 2 weeks he has been seeing a human like figure that mostly shows up behind him in his car.  He states that it looks like there is a towel over his head and covering his body and he is only able to see your nose and mouth.  He states that this finger does not speak to him and does not tell him to hurt himself or others and states that he has not had any SI or HI.  He denies any headaches or other symptoms.  He denies any prior history of hallucinations.  The history is provided by the patient.       Home Medications Prior to Admission medications   Medication Sig Start Date End Date Taking? Authorizing Provider  atenolol (TENORMIN) 50 MG tablet Take 50 mg by mouth daily.   Yes [provider]  atorvastatin (LIPITOR) 80 MG tablet Take 80 mg by mouth at bedtime.   Yes [provider]  cetirizine (ZYRTEC) 10 MG tablet Take 10 mg by mouth daily as needed for allergies or rhinitis.   Yes [provider]  COZAAR 50 MG tablet Take 50 mg by mouth daily.   Yes [provider]  hydrochlorothiazide (HYDRODIURIL) 25 MG tablet Take 12.5 mg by mouth daily.   Yes [provider]  latanoprost (XALATAN) 0.005 % ophthalmic solution Place 1 drop into both eyes at bedtime.  07/27/13  Yes [provider]  metFORMIN (GLUCOPHAGE) 500 MG tablet Take 500 mg by mouth daily with breakfast.   Yes [provider]  tamsulosin (FLOMAX) 0.4 MG CAPS capsule Take 0.4 mg by mouth at bedtime.   Yes [provider]  finasteride (PROSCAR) 5 MG tablet  Take 5 mg by mouth daily.    [provider]  timolol (TIMOPTIC) 0.5 % ophthalmic solution Place 1 drop into both eyes 2 (two) times daily.  08/08/13   [provider]      Allergies    Ace inhibitors, Lisinopril, and Sildenafil    Review of Systems   Review of Systems  Physical Exam Updated Vital Signs BP (!) 151/94   Pulse 62   Temp 98.7 F (37.1 C) (Oral)   Resp 17   Ht  (1.854 m)   Wt 92.5 kg   SpO2 93%   BMI 26.91 kg/m  Physical Exam Vitals and nursing note reviewed.  Constitutional:      General: He is not in acute distress.    Appearance: Normal appearance.  HENT:     Head: Normocephalic and atraumatic.     Nose: Nose normal.     Mouth/Throat:     Mouth: Mucous membranes are moist.     Pharynx: Oropharynx is clear.  Eyes:     Extraocular Movements: Extraocular movements intact.     Conjunctiva/sclera: Conjunctivae normal.  Cardiovascular:     Rate and Rhythm: Normal rate and regular rhythm.     Heart sounds: Normal heart sounds.  Pulmonary:     Effort: Pulmonary effort is normal.     Breath sounds:  Normal breath sounds.  Abdominal:     General: Abdomen is flat.     Palpations: Abdomen is soft.     Tenderness: There is no abdominal tenderness.  Musculoskeletal:        General: Normal range of motion.     Cervical back: Normal range of motion and neck supple.  Skin:    General: Skin is warm and dry.  Neurological:     General: No focal deficit present.     Mental Status: He is alert and oriented to person, place, and time.     Sensory: No sensory deficit.     Motor: No weakness.  Psychiatric:        Mood and Affect: Mood normal.        Behavior: Behavior normal.     Comments: Active hallucinations on exam     ED Results / Procedures / Treatments   Labs (all labs ordered are listed, but only abnormal results are displayed) Labs Reviewed  BASIC METABOLIC PANEL - Abnormal; Notable for the following components:      Result  Value   Potassium 3.3 (*)    Glucose, Bld 107 (*)    All other components within normal limits  SALICYLATE LEVEL - Abnormal; Notable for the following components:   Salicylate Lvl <7.0 (*)    All other components within normal limits  ACETAMINOPHEN LEVEL - Abnormal; Notable for the following components:   Acetaminophen (Tylenol), Serum <10 (*)    All other components within normal limits  CBC  ETHANOL  URINALYSIS, ROUTINE W REFLEX MICROSCOPIC  RAPID URINE DRUG SCREEN, HOSP PERFORMED    EKG None  Radiology CT Head Wo Contrast  Result Date: 05/06/2022 CLINICAL DATA:  Altered mental status EXAM: CT HEAD WITHOUT CONTRAST TECHNIQUE: Contiguous axial images were obtained from the base of the skull through the vertex without intravenous contrast. RADIATION DOSE REDUCTION: This exam was performed according to the departmental dose-optimization program which includes automated exposure control, adjustment of the mA and/or kV according to patient size and/or use of iterative reconstruction technique. COMPARISON:  07/29/2005 FINDINGS: Brain: There is no mass, hemorrhage or extra-axial collection. The size and configuration of the ventricles and extra-axial CSF spaces are normal. There is hypoattenuation of the white matter, most commonly indicating chronic small vessel disease. Vascular: No abnormal hyperdensity of the major intracranial arteries or dural venous sinuses. No intracranial atherosclerosis. Skull: The visualized skull base, calvarium and extracranial soft tissues are normal. Sinuses/Orbits: No fluid levels or advanced mucosal thickening of the visualized paranasal sinuses. No mastoid or middle ear effusion. The orbits are normal. IMPRESSION: Chronic small vessel disease without acute intracranial abnormality. Electronically Signed   By: Deatra Robinson M.D.   On: 05/06/2022 19:37    Procedures Procedures    Medications Ordered in ED Medications  atenolol (TENORMIN) tablet 50 mg ( Oral  Canceled Entry 05/06/22 2222)  atorvastatin (LIPITOR) tablet 80 mg (80 mg Oral Given 05/06/22 2223)  losartan (COZAAR) tablet 50 mg (50 mg Oral Given 05/06/22 2223)  finasteride (PROSCAR) tablet 5 mg (5 mg Oral Given 05/06/22 2222)  hydrochlorothiazide (HYDRODIURIL) tablet 12.5 mg (12.5 mg Oral Given 05/06/22 2222)  latanoprost (XALATAN) 0.005 % ophthalmic solution 1 drop (1 drop Both Eyes Not Given 05/06/22 2223)  metFORMIN (GLUCOPHAGE) tablet 500 mg (has no administration in time range)  tamsulosin (FLOMAX) capsule 0.4 mg (0.4 mg Oral Given 05/06/22 2222)  tamsulosin (FLOMAX) 0.4 MG capsule (has no administration in time range)  losartan (COZAAR)  25 MG tablet (has no administration in time range)  atorvastatin (LIPITOR) 40 MG tablet (has no administration in time range)  hydrochlorothiazide (HYDRODIURIL) 12.5 MG tablet (has no administration in time range)  finasteride (PROSCAR) 5 MG tablet (has no administration in time range)  atenolol (TENORMIN) 50 MG tablet (has no administration in time range)  potassium chloride SA (KLOR-CON M) 20 MEQ CR tablet (has no administration in time range)  potassium chloride SA (KLOR-CON M) CR tablet 40 mEq (40 mEq Oral Given 05/06/22 2222)    ED Course/ Medical Decision Making/ A&P Clinical Course as of 05/06/22 2355  Tue May 06, 2022  2145 Labs with mild hypokalemia, otherwise negative. Urine is pending. He is medically cleared for psych eval. [VK]  2332 Per Venda Rodes psych SW. Patient was cleared by psychiatry and recommended neurology follow up. [VK]    Clinical Course User Index [VK] Rexford Maus, DO                             Medical Decision Making This patient presents to the ED with chief complaint(s) of hallucinations with pertinent past medical history of hypertension, diabetes which further complicates the presenting complaint. The complaint involves an extensive differential diagnosis and also carries with it a high risk of  complications and morbidity.    The differential diagnosis includes ICH, mass effect, electrolyte abnormality, infection, psychosis  Additional history obtained: Additional history obtained from police Records reviewed Care Everywhere/External Records  ED Course and Reassessment: Patient was brought into the emergency department under IVC by police for his hallucinations.  Per the police he has been called multiple times about his hallucinations.  Patient was initially evaluated by provider in triage and had labs and head CT performed for medical clearance prior to psychiatry evaluation.  Independent labs interpretation:  The following labs were independently interpreted: Mild hypokalemia otherwise within normal range  Independent visualization of imaging: - I independently visualized the following imaging with scope of interpretation limited to determining acute life threatening conditions related to emergency care: CT head, which revealed no acute disease  Consultation: - Consulted or discussed management/test interpretation w/ external professional: TTS     Risk Prescription drug management.          Final Clinical Impression(s) / ED Diagnoses Final diagnoses:  Hallucinations, visual    Rx / DC Orders ED Discharge Orders          Ordered    Ambulatory referral to Neurology       Comments: An appointment is requested in approximately: 4 weeks   05/06/22 2353              Rexford Maus, DO 05/06/22 2149    Elayne Snare K, DO 05/06/22 2355

## 2022-05-06 NOTE — ED Triage Notes (Signed)
Pt BIB by [employer] for hallucinations that started 2 weeks ago. Per [employer], pt is seeing small children's heads in his house and he tries to chase after them. Pt was in [employer]. Pt is IVC'd by [employer] due to hallucinations. Pt has called PD every time he has seen the children's heads. Pt denies SI/HI.

## 2022-05-06 NOTE — BH Assessment (Incomplete)
Comprehensive Clinical Assessment (CCA) Note  05/06/2022 Susa Day. [id number]  DISPOSITION: Gave clinical report to Sindy Guadeloupe, NP who reviewed Pt's medical record and determined Pt does not meet criteria for inpatient psychiatric treatment and states Pt is psychiatrically cleared. He recommends Pt has a neurological evaluation. Notified Dr. Elayne Snare and Anda Latina, RN of recommendation via secure message.  The patient demonstrates the following risk factors for suicide: Chronic risk factors for suicide include: demographic factors (male, >10 y/o). Acute risk factors for suicide include: N/A. Protective factors for this patient include: positive social support, responsibility to others (children, family), coping skills, hope for the future, religious beliefs against suicide, and life satisfaction. Considering these factors, the overall suicide risk at this point appears to be low. Patient is appropriate for outpatient follow up.  Pt is a 77 year old widowed male who presents unaccompanied to [facility] after being petitioned for involuntary commitment by law enforcement due to visual hallucinations. Pt says three unknown children between the ages of 42 and 41 somehow got into his house. He says he asked them who they are and where there parents are but they would not respond. He called Patent examiner and says the children quickly left before the officers saw them. He says the Hydrographic surveyor left and a short time later the children returned. Pt states he called law enforcement again and once again the children disappeared. He says the Designer, television/film set recommended he come to the ED for evaluation.  Pt describes other incidents of seeing people who don't respond to him. He estimates he started noticing this approximately two weeks ago. He says there were people sitting in his car and he told them to leave but the just sat there. He says his neighbor asked to borrow  his car and he refused and he thought it might be them. He denies currently seeing any other people in the room.   Pt denies any history of mental health treatment, adding that he never had a need to see a psychiatrist. He denies symptoms of depression. He says he sleeps well and his appetite is good. He denies auditory hallucinations. He denies current suicidal ideation or history of suicide attempts. He denies homicidal ideation or history of violence. He denies alcohol or substance use.  Pt states he lives alone. 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 He says he never misses church and enjoys the food and fellowship. He has been married four times. He states he and his last wife both contracted COVID in 2018-06-19 and she died. He has a daughter who lives in [place], [place] and a stepdaughter who lives in [place]. He denies history of abuse. He denies legal problems. He denies access to firearms.  Pt is dressed in hospital scrubs and wears eyeglasses. He may have a mild hearing impairment but was able to communicate through the tele-cart. He is alert and oriented x4. Pt speaks in a clear tone, at moderate volume and normal pace. Motor behavior appears normal. Eye contact is good. Pt's mood is euthymic and affect is congruent with mood. Thought process is coherent and relevant. There is no indication Pt is currently responding to internal stimuli or experiencing delusional thought content.   Chief Complaint:  Chief Complaint  Patient presents with  . IVC   Visit Diagnosis: F29 Unspecified psychotic disorder   CCA Screening, Triage and Referral (STR)  Patient Reported Information  How did you hear about [place]? Legal System  What Is the Reason for Your Visit/Call Today? Pt petitioned for IVC by law enforcement due to Pt describing hallucinations. Pt reports seeing children in his home and repeatedly contacting law enforcement. He also reports seeing  people sitting in his car. These children and adults do not respond when he speaks to them.  How Long Has This Been Causing You Problems? 1 wk - 1 month  What Do You Feel Would Help You the Most Today? Treatment for Depression or other mood problem   Have You Recently Had Any Thoughts About Hurting Yourself? No  Are You Planning to Commit Suicide/Harm Yourself At This time? No   Flowsheet Row ED from 05/06/2022 in [facility] Emergency Department at [facility]  C-SSRS RISK CATEGORY No Risk       Have you Recently Had Thoughts About Hurting Someone [name]? No  Are You Planning to Harm Someone at This Time? No  Explanation: Pt denies current suicidal ideation or homicidal ideation   Have You Used Any Alcohol or Drugs in the Past 24 Hours? No  What Did You Use and How Much? Pt denies alcohol or substance use   Do You Currently Have a Therapist/Psychiatrist? No  Name of Therapist/Psychiatrist: Name of Therapist/Psychiatrist: No mental health providers   Have You Been Recently Discharged From Any Office Practice or Programs? No  Explanation of Discharge From Practice/Program: Pt denies discharge from practice     CCA Screening Triage Referral Assessment Type of Contact: Tele-Assessment  Telemedicine Service Delivery: Telemedicine service delivery: This service was provided via telemedicine using a 2-way, interactive audio and video technology  Is this Initial or Reassessment? Is this Initial or Reassessment?: Initial Assessment  Date Telepsych consult ordered in [facility]:  Date Telepsych consult ordered in [facility]: 05/06/22  Time Telepsych consult ordered in [facility]:  Time Telepsych consult ordered in [facility]: 1901  Location of Assessment: [facility] ED  Provider Location: [facility] Assessment Services   Collateral Involvement: Patent examiner   Does Patient Have a Automotive engineer Guardian? No  Legal Guardian Contact Information: Pt does not have a legal guardian  Copy of  Legal Guardianship Form: -- (Pt does not have a legal guardian)  Legal Guardian Notified of Arrival: -- (Pt does not have a legal guardian)  Legal Guardian Notified of Pending Discharge: -- (Pt does not have a legal guardian)  If Minor and Not Living with Parent(s), Who has Custody? Pt is an adult  Is CPS involved or ever been involved? Never  Is APS involved or ever been involved? Never   Patient Determined To Be At Risk for Harm To Self or Others Based on Review of Patient Reported Information or Presenting Complaint? No  Method: No Plan  Availability of Means: No access or NA  Intent: Vague intent or NA  Notification Required: No need or identified person  Additional Information for Danger to Others Potential: -- (No history of violence)  Additional Comments for Danger to Others Potential: Pt denies history of violence  Are There Guns or Other Weapons in Your Home? No  Types of Guns/Weapons: Pt denies access to firearms  Are These Weapons Safely Secured?                            -- (Pt denies access to firearms)  Who Could Verify You Are Able To Have These Secured: Pt denies access to firearms  Do You Have any Outstanding Charges, Pending Court Dates, Parole/Probation? Pt denies legal problems  Contacted To Inform of Risk of Harm To Self or Others: Other: Comment (No imminent safety concern)    Does Patient Present under Involuntary Commitment? Yes    [place] of Residence: [place]   Patient Currently Receiving the Following Services: Not Receiving Services   Determination of Need: Emergent (2 hours)   Options For Referral: Geropsychiatric Facility; [facility]; Medication Management; Outpatient Therapy     CCA Biopsychosocial Patient Reported Schizophrenia/Schizoaffective Diagnosis in Past: No   Strengths: Pt is independent, employment, and enjoys talking to people   Mental Health Symptoms Depression:   None   Duration of Depressive  symptoms:    Mania:   None   Anxiety:    None   Psychosis:   Hallucinations   Duration of Psychotic symptoms:  Duration of Psychotic Symptoms: Less than six months   Trauma:   None   Obsessions:   None   Compulsions:   None   Inattention:   None   Hyperactivity/Impulsivity:   None   Oppositional/Defiant Behaviors:   None   Emotional Irregularity:   None   Other Mood/Personality Symptoms:   None noted    Mental Status Exam Appearance and self-care  Stature:   Tall   Weight:   Average weight   Clothing:   -- (Scrubs)   Grooming:   Normal   Cosmetic use:   None   Posture/gait:   Normal   Motor activity:   Not Remarkable   Sensorium  Attention:   Normal   Concentration:   Normal   Orientation:   X5   Recall/memory:   Normal   Affect and Mood  Affect:   Appropriate   Mood:   Euthymic   Relating  Eye contact:   Normal   Facial expression:   Responsive   Attitude toward examiner:   Cooperative   Thought and Language  Speech flow:  Normal   Thought content:   Illusions   Preoccupation:   Ruminations   Hallucinations:   Visual   Organization:   Patent examiner of Knowledge:   Average   Intelligence:   Average   Abstraction:   Normal   Judgement:   Fair   Dance movement psychotherapist:   Adequate; Variable   Insight:   Lacking   Decision Making:   Normal   Social Functioning  Social Maturity:   Responsible   Social Judgement:   Normal   Stress  Stressors:   Grief/losses   Coping Ability:   Normal   Skill Deficits:   None   Supports:   Family     Religion: Religion/Spirituality Are You A Religious Person?: Yes What is Your Religious Affiliation?: Christian How Might This Affect Treatment?: Pt says he never misses church  Leisure/Recreation: Leisure / Recreation Do You Have Hobbies?: Yes Leisure and Hobbies: Playing basketball  Exercise/Diet: Exercise/Diet Do  You Exercise?: Yes What Type of Exercise Do You Do?: Run/Walk How Many Times a Week Do You Exercise?: 4-5 times a week Have You Gained or Lost A Significant Amount of Weight in the Past Six Months?: No Do You Follow a Special Diet?: No Do You Have Any Trouble Sleeping?: No   CCA Employment/Education Employment/Work Situation: Employment / Work Situation Employment Situation: Employed Work Stressors: Works as a crossing guard at a school Patient's Job has Been Impacted by Current Illness: No Has Patient ever Been  in the Military?: Yes (Describe in comment) (Served in [employer] 22 years) Did You Receive Any Psychiatric Treatment/Services While in the [employer]?: No  Education: Education Is Patient Currently Attending School?: No Last Grade Completed: 12 Did You Product manager?: No Did You Have An Individualized Education Program (IIEP): No Did You Have Any Difficulty At School?: No Patient's Education Has Been Impacted by Current Illness: No   CCA Family/Childhood History Family and Relationship History: Family history Marital status: Widowed Widowed, when?: 2000 Does patient have children?: Yes How many children?: 2 How is patient's relationship with their children?: Daughter lives in [place], [place]. Stepdaughter lives in [place]  Childhood History:  Childhood History By whom was/is the patient raised?: Both parents Did patient suffer any verbal/emotional/physical/sexual abuse as a child?: No Did patient suffer from severe childhood neglect?: No Has patient ever been sexually abused/assaulted/raped as an adolescent or adult?: No Was the patient ever a victim of a crime or a disaster?: No Witnessed domestic violence?: No Has patient been affected by domestic violence as an adult?: No       CCA Substance Use Alcohol/Drug Use: Alcohol / Drug Use Pain Medications: Denies abuse Prescriptions: Denies abuse Over the Counter: Denies abuse History of alcohol / drug use?: No  history of alcohol / drug abuse Longest period of sobriety (when/how long): Pt says he has not drank in 30 years                         ASAM's:  Six Dimensions of Multidimensional Assessment  Dimension 1:  Acute Intoxication and/or Withdrawal Potential:      Dimension 2:  Biomedical Conditions and Complications:      Dimension 3:  Emotional, Behavioral, or Cognitive Conditions and Complications:     Dimension 4:  Readiness to Change:     Dimension 5:  Relapse, Continued use, or Continued Problem Potential:     Dimension 6:  Recovery/Living Environment:     ASAM Severity Score:    ASAM Recommended Level of Treatment:     Substance use Disorder (SUD)    Recommendations for Services/Supports/Treatments:    Discharge Disposition: Discharge Disposition Medical Exam completed: Yes  DSM5 Diagnoses: Patient Active Problem List   Diagnosis Date Noted  . OSA on CPAP 05/20/2021  . Episodic memory loss 05/20/2021  . Acute respiratory failure with hypoxia 02/22/2019  . Pneumonia 02/22/2019  . Suspected COVID-19 virus infection 02/21/2019  . OSA treated with BiPAP 10/14/2013  . Compliance poor 10/14/2013  . Snoring 10/14/2013  . 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

## 2022-05-06 NOTE — BH Assessment (Signed)
Comprehensive Clinical Assessment (CCA) Note  05/06/2022 Susa Day. [id number]  DISPOSITION: Gave clinical report to Sindy Guadeloupe, NP who reviewed Pt's medical record and determined Pt does not meet criteria for inpatient psychiatric treatment and states Pt is psychiatrically cleared. He recommends Pt has a neurological evaluation. Notified Dr. Elayne Snare and Anda Latina, RN of recommendation via secure message.  The patient demonstrates the following risk factors for suicide: Chronic risk factors for suicide include: demographic factors (male, >73 y/o). Acute risk factors for suicide include: N/A. Protective factors for this patient include: positive social support, responsibility to others (children, family), coping skills, hope for the future, religious beliefs against suicide, and life satisfaction. Considering these factors, the overall suicide risk at this point appears to be low. Patient is appropriate for outpatient follow up.  Pt is a 77 year old widowed male who presents unaccompanied to [facility] after being petitioned for involuntary commitment by law enforcement due to visual hallucinations. Pt says three unknown children between the ages of 30 and 27 somehow got into his house. He says he asked them who they are and where there parents are but they would not respond. He called Patent examiner and says the children quickly left before the officers saw them. He says the Hydrographic surveyor left and a short time later the children returned. Pt states he called law enforcement again and once again the children disappeared. He says the Designer, television/film set recommended he come to the ED for evaluation.  Pt describes other incidents of seeing people who don't respond to him. He estimates he started noticing this approximately two weeks ago. He says there were people sitting in his car and he told them to leave but the just sat there. He says his neighbor asked to borrow  his car and he refused and he thought it might be them. He denies currently seeing any other people in the room.   Pt denies any history of mental health treatment, adding that he never had a need to see a psychiatrist. He denies symptoms of depression. He says he sleeps well and his appetite is good. He denies auditory hallucinations. He denies current suicidal ideation or history of suicide attempts. He denies homicidal ideation or history of violence. He denies alcohol or substance use.  Pt states he lives alone. 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 He says he never misses church and enjoys the food and fellowship. He has been married four times. He states he and his last wife both contracted COVID in 11-Jun-2018 and she died. He has a daughter who lives in [place], [place] and a stepdaughter who lives in [place]. He denies history of abuse. He denies legal problems. He denies access to firearms.  Pt is dressed in hospital scrubs and wears eyeglasses. He may have a mild hearing impairment but was able to communicate through the tele-cart. He is alert and oriented x4. Pt speaks in a clear tone, at moderate volume and normal pace. Motor behavior appears normal. Eye contact is good. Pt's mood is euthymic and affect is congruent with mood. Thought process is coherent and relevant. There is no indication from Pt's report or behavior that he is responding to internal stimuli. He is pleasant and cooperative. He is concerned about going to work in the morning.   Chief Complaint:  Chief Complaint  Patient presents with   IVC   Visit Diagnosis: F29  Unspecified psychotic disorder   CCA Screening, Triage and Referral (STR)  Patient Reported Information How did you hear about [place]? Legal System  What Is the Reason for Your Visit/Call Today? Pt petitioned for IVC by law enforcement due to Pt describing hallucinations. Pt reports seeing children in his home and  repeatedly contacting law enforcement. He also reports seeing people sitting in his car. These children and adults do not respond when he speaks to them.  How Long Has This Been Causing You Problems? 1 wk - 1 month  What Do You Feel Would Help You the Most Today? Treatment for Depression or other mood problem   Have You Recently Had Any Thoughts About Hurting Yourself? No  Are You Planning to Commit Suicide/Harm Yourself At This time? No   Flowsheet Row ED from 05/06/2022 in [facility] at [facility]  C-SSRS RISK CATEGORY No Risk       Have you Recently Had Thoughts About Hurting Someone [name]? No  Are You Planning to Harm Someone at This Time? No  Explanation: Pt denies current suicidal ideation or homicidal ideation   Have You Used Any Alcohol or Drugs in the Past 24 Hours? No  What Did You Use and How Much? Pt denies alcohol or substance use   Do You Currently Have a Therapist/Psychiatrist? No  Name of Therapist/Psychiatrist: Name of Therapist/Psychiatrist: No mental health providers   Have You Been Recently Discharged From Any Office Practice or Programs? No  Explanation of Discharge From Practice/Program: Pt denies discharge from practice     CCA Screening Triage Referral Assessment Type of Contact: Tele-Assessment  Telemedicine Service Delivery: Telemedicine service delivery: This service was provided via telemedicine using a 2-way, interactive audio and video technology  Is this Initial or Reassessment? Is this Initial or Reassessment?: Initial Assessment  Date Telepsych consult ordered in [facility]:  Date Telepsych consult ordered in [facility]: 05/06/22  Time Telepsych consult ordered in [facility]:  Time Telepsych consult ordered in [facility]: 1901  Location of Assessment: [facility] ED  Provider Location: [facility] Assessment Services   Collateral Involvement: Patent examiner   Does Patient Have a Automotive engineer Guardian? No  Legal Guardian  Contact Information: Pt does not have a legal guardian  Copy of Legal Guardianship Form: -- (Pt does not have a legal guardian)  Legal Guardian Notified of Arrival: -- (Pt does not have a legal guardian)  Legal Guardian Notified of Pending Discharge: -- (Pt does not have a legal guardian)  If Minor and Not Living with Parent(s), Who has Custody? Pt is an adult  Is CPS involved or ever been involved? Never  Is APS involved or ever been involved? Never   Patient Determined To Be At Risk for Harm To Self or Others Based on Review of Patient Reported Information or Presenting Complaint? No  Method: No Plan  Availability of Means: No access or NA  Intent: Vague intent or NA  Notification Required: No need or identified person  Additional Information for Danger to Others Potential: -- (No history of violence)  Additional Comments for Danger to Others Potential: Pt denies history of violence  Are There Guns or Other Weapons in Your Home? No  Types of Guns/Weapons: Pt denies access to firearms  Are These Weapons Safely Secured?                            -- (Pt denies access to firearms)  Who Could Verify You Are Able To Have These Secured: Pt denies access to firearms  Do You Have any Outstanding Charges, Pending Court Dates, Parole/Probation? Pt denies legal problems  Contacted To Inform of Risk of Harm To Self or Others: Other: Comment (No imminent safety concern)    Does Patient Present under Involuntary Commitment? Yes    [place] of Residence: [place]   Patient Currently Receiving the Following Services: Not Receiving Services   Determination of Need: Emergent (2 hours)   Options For Referral: Geropsychiatric Facility; [facility]; Medication Management; Outpatient Therapy     CCA Biopsychosocial Patient Reported Schizophrenia/Schizoaffective Diagnosis in Past: No   Strengths: Pt is independent, employment, and enjoys talking to people   Mental  Health Symptoms Depression:   None   Duration of Depressive symptoms:    Mania:   None   Anxiety:    None   Psychosis:   Hallucinations   Duration of Psychotic symptoms:  Duration of Psychotic Symptoms: Less than six months   Trauma:   None   Obsessions:   None   Compulsions:   None   Inattention:   None   Hyperactivity/Impulsivity:   None   Oppositional/Defiant Behaviors:   None   Emotional Irregularity:   None   Other Mood/Personality Symptoms:   None noted    Mental Status Exam Appearance and self-care  Stature:   Tall   Weight:   Average weight   Clothing:   -- (Scrubs)   Grooming:   Normal   Cosmetic use:   None   Posture/gait:   Normal   Motor activity:   Not Remarkable   Sensorium  Attention:   Normal   Concentration:   Normal   Orientation:   X5   Recall/memory:   Normal   Affect and Mood  Affect:   Appropriate   Mood:   Euthymic   Relating  Eye contact:   Normal   Facial expression:   Responsive   Attitude toward examiner:   Cooperative   Thought and Language  Speech flow:  Normal   Thought content:   Illusions   Preoccupation:   Ruminations   Hallucinations:   Visual   Organization:   Patent examiner of Knowledge:   Average   Intelligence:   Average   Abstraction:   Normal   Judgement:   Fair   Dance movement psychotherapist:   Adequate; Variable   Insight:   Lacking   Decision Making:   Normal   Social Functioning  Social Maturity:   Responsible   Social Judgement:   Normal   Stress  Stressors:   Grief/losses   Coping Ability:   Normal   Skill Deficits:   None   Supports:   Family     Religion: Religion/Spirituality Are You A Religious Person?: Yes What is Your Religious Affiliation?: Christian How Might This Affect Treatment?: Pt says he never misses church  Leisure/Recreation: Leisure / Recreation Do You Have Hobbies?: Yes Leisure and  Hobbies: Playing basketball  Exercise/Diet: Exercise/Diet Do You Exercise?: Yes What Type of Exercise Do You Do?: Run/Walk How Many Times a Week Do You Exercise?: 4-5 times a week Have You Gained or Lost A Significant Amount of Weight in the Past Six Months?: No Do You Follow a Special Diet?: No Do You Have Any Trouble Sleeping?: No   CCA Employment/Education Employment/Work Situation: Employment / Work Situation Employment Situation: Employed 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 Patient's Job has Been Impacted by Current Illness: No Has Patient ever Been in the Military?: Yes (Describe in comment) (Served in [employer] 22 years) Did You Receive Any Psychiatric Treatment/Services While in Equities trader?: No  Education: Education Is Patient Currently Attending School?: No Last Grade Completed: 12 Did You Product manager?: No Did You Have An Individualized Education Program (IIEP): No Did You Have Any Difficulty At School?: No Patient's Education Has Been Impacted by Current Illness: No   CCA Family/Childhood History Family and Relationship History: Family history Marital status: Widowed Widowed, when?: 2000 Does patient have children?: Yes How many children?: 2 How is patient's relationship with their children?: Daughter lives in [place], [place]. Stepdaughter lives in [place]  Childhood History:  Childhood History By whom was/is the patient raised?: Both parents Did patient suffer any verbal/emotional/physical/sexual abuse as a child?: No Did patient suffer from severe childhood neglect?: No Has patient ever been sexually abused/assaulted/raped as an adolescent or adult?: No Was the patient ever a victim of a crime or a disaster?: No Witnessed domestic violence?: No Has patient been affected by domestic violence as an adult?: No       CCA Substance Use Alcohol/Drug Use: Alcohol / Drug Use Pain Medications: Denies abuse Prescriptions: Denies abuse Over  the Counter: Denies abuse History of alcohol / drug use?: No history of alcohol / drug abuse Longest period of sobriety (when/how long): Pt says he has not drank in 30 years                         ASAM's:  Six Dimensions of Multidimensional Assessment  Dimension 1:  Acute Intoxication and/or Withdrawal Potential:      Dimension 2:  Biomedical Conditions and Complications:      Dimension 3:  Emotional, Behavioral, or Cognitive Conditions and Complications:     Dimension 4:  Readiness to Change:     Dimension 5:  Relapse, Continued use, or Continued Problem Potential:     Dimension 6:  Recovery/Living Environment:     ASAM Severity Score:    ASAM Recommended Level of Treatment:     Substance use Disorder (SUD)    Recommendations for Services/Supports/Treatments:    Discharge Disposition: Discharge Disposition Medical Exam completed: Yes  DSM5 Diagnoses: Patient Active Problem List   Diagnosis Date Noted   OSA on CPAP 05/20/2021   Episodic memory loss 05/20/2021   Acute respiratory failure with hypoxia 02/22/2019   Pneumonia 02/22/2019   Suspected COVID-19 virus infection 02/21/2019   OSA treated with BiPAP 10/14/2013   Compliance poor 10/14/2013   Snoring 10/14/2013   Sleep disorder, circadian, 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

## 2022-05-22 ENCOUNTER — Encounter: Payer: Self-pay | Admitting: Neurology

## 2022-05-22 ENCOUNTER — Encounter: Payer: Self-pay | Admitting: Psychology

## 2022-05-22 ENCOUNTER — Ambulatory Visit (INDEPENDENT_AMBULATORY_CARE_PROVIDER_SITE_OTHER): Payer: Medicare Other | Admitting: Neurology

## 2022-05-22 VITALS — BP 146/91 | HR 53 | Ht 73.0 in | Wt 187.6 lb

## 2022-05-22 DIAGNOSIS — G3183 Dementia with Lewy bodies: Secondary | ICD-10-CM

## 2022-05-22 DIAGNOSIS — F0282 Dementia in other diseases classified elsewhere, unspecified severity, with psychotic disturbance: Secondary | ICD-10-CM

## 2022-05-22 DIAGNOSIS — G629 Polyneuropathy, unspecified: Secondary | ICD-10-CM

## 2022-05-22 DIAGNOSIS — R413 Other amnesia: Secondary | ICD-10-CM

## 2022-05-22 DIAGNOSIS — R441 Visual hallucinations: Secondary | ICD-10-CM | POA: Insufficient documentation

## 2022-05-22 NOTE — Progress Notes (Addendum)
Provider:  Melvyn Novas, MD  Primary Care Physician:  Gaspar Garbe, MD [address] [place] [address]     Referring Provider: Hervey Ard, Pa-c [address] [place],  [place] [address]          Chief Complaint according to patient   Patient presents with:     New Problem for established Sleep Patient (Initial Visit)     Here for new memory concerns. Pt states that his memory issues started declining about 5 years ago. Pt states that Dementia runs in his family and has concerns about his memory. Pt's daughter states that pt has a hard time putting words and sentences together. Pt's Daughter states that pt is seeing people that aren't there.       HISTORY OF PRESENT ILLNESS:  Jerry Caldwell. is a 77 y.o. male patient who is here for evaluation of memory deficits on  05/22/2022 ,  has on 05-06-22 an ED visit : Jerry Caldwell. is a 77 y.o. male.   Patient is a 77 year old male with a past medical history of diabetes and hypertension presenting to the emergency department with hallucinations.  The patient states for the last 2 weeks he has been seeing a human like figure that mostly shows up behind him in his car.  He states that it looks like there is a towel over his head and covering his body and he is only able to see your nose and mouth.  He states that this finger does not speak to him and does not tell him to hurt himself or others and states that he has not had any SI or HI.  He denies any headaches or other symptoms.  He denies any prior history of hallucinations. The history is provided by the patient.    Chief concern according to patient's daughter : " Visual hallucinations, over the last few months. Seeing a lady and kids in his house, they have been there for months, hiding somewhere in the house- nobody else has met them-  no vocal communication- no auditory hallucination.  He is working as a Chief Strategy Officer. "   He has called the  police several times and they never can find them. His daughter neither.   He  has previously been seen here for sleep.  He has a cpap set up feb 2016. States he does still use it but doesn't help with sleep.  Had a new sleep study in 2023, followed up with [facility] oday 11/07/21:   Jerry Caldwell is a 77 year old male with a history of obstructive sleep apnea on CPAP.  He returns today for follow-up.  Unfortunately we were unable to get a download today.  He does state that he tries to use the machine nightly.  However he does not feel that it is working correctly.  He would like a new machine if possible.   HISTORY  08/05/21:   Jerry Caldwell is a 77 year old male with a history of OSA on CPAP. He returns today for follow-up. Reports that some nights he forget to put it on and some nights he takes it off during the night. Still feeling fatigued during the day. DL is below.        New Patient (Initial Visit) 05-06-2021:       Pt alone, rm 10. He has lately been having moments of "black outs" In feb he had a moment  where he was working,  went home and fell asleep at 3:30 pm. When he woke up it was around 7p and he remembered he had to be somewhere -but couldn't remember where. 1 month ago he had another episode where he forgot the date. He did have covid in the past and noticed the disorientation  concerns were worse after this infection    Other          The hallucinations have been evident  for the last weeks may be months.  His wife passed away in  2019-02-22-  wife died of Covid and he was hospitalized at the time.     Review of Systems: Out of a complete 14 system review, the patient complains of only the following symptoms, and all other reviewed systems are negative.:     Social History   Socioeconomic History   Marital status: Widowed    Spouse name: Jerry Caldwell   Number of children: 4   Years of education: College   Highest education level: Not on file  Occupational History    Not on file  Tobacco Use   Smoking status: Former   Smokeless tobacco: Never  Substance and Sexual Activity   Alcohol use: No   Drug use: No   Sexual activity: Not on file  Other Topics Concern   Not on file  Social History Narrative   Patient is married Jerry Caldwell) Patient has four children.Patient has a college degree.Patient is a Doctor, 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      Update 11/07/2021   Patient lost his wife 3 years ago    Social Determinants of Corporate investment banker Strain: Not on file  Food Insecurity: Not on file  Transportation Needs: Not on file  Physical Activity: Not on file  Stress: Not on file  Social Connections: Not on file    Family History  Problem Relation Age of Onset   Glaucoma Mother    Cancer - Lung Mother    Diabetes Sister    Diabetes type II Maternal Aunt    Colon cancer Neg Hx    Colon polyps Neg Hx    Esophageal cancer Neg Hx    Rectal cancer Neg Hx    Stomach cancer Neg Hx    Sleep apnea Neg Hx     Past Medical History:  Diagnosis Date   Allergy    Arthritis    right knee    Diabetes (HCC)    Diverticulosis    Glaucoma    HTN (hypertension)    Hyperlipidemia    Hypogonadism in male    Migraines    Sleep apnea    uses cpap     Past Surgical History:  Procedure Laterality Date   ANTERIOR CRUCIATE LIGAMENT REPAIR  1992   COLONOSCOPY  03/31/2006   Diverticulosis   ROTATOR CUFF REPAIR Right 2008     Current Outpatient Medications on File Prior to Visit  Medication Sig Dispense Refill   atenolol (TENORMIN) 50 MG tablet Take 50 mg by mouth daily.     cetirizine (ZYRTEC) 10 MG tablet Take 10 mg by mouth daily as needed for allergies or rhinitis.     COZAAR 50 MG tablet Take 50 mg by mouth daily.     hydrochlorothiazide (HYDRODIURIL) 25 MG tablet Take 12.5 mg by mouth daily.     metFORMIN  (GLUCOPHAGE)  500 MG tablet Take 500 mg by mouth daily with breakfast.     atorvastatin (LIPITOR) 80 MG tablet Take 80 mg by mouth at bedtime. (Patient not taking: Reported on 05/22/2022)     finasteride (PROSCAR) 5 MG tablet Take 5 mg by mouth daily. (Patient not taking: Reported on 05/22/2022)     latanoprost (XALATAN) 0.005 % ophthalmic solution Place 1 drop into both eyes at bedtime.  (Patient not taking: Reported on 05/22/2022)     tamsulosin (FLOMAX) 0.4 MG CAPS capsule Take 0.4 mg by mouth at bedtime. (Patient not taking: Reported on 05/22/2022)     timolol (TIMOPTIC) 0.5 % ophthalmic solution Place 1 drop into both eyes 2 (two) times daily.  (Patient not taking: Reported on 05/22/2022)     No current facility-administered medications on file prior to visit.    Allergies  Allergen Reactions   Ace Inhibitors Cough   Lisinopril Cough   Sildenafil Other (See Comments)    Dizziness, Visual disturbance, Glaucoma     DIAGNOSTIC DATA (LABS, IMAGING, TESTING) - I reviewed patient records, labs, notes, testing and imaging myself where available.  IMPRESSION: This MRI of the brain with and without contrast shows the following: 1.   No acute findings.  Normal enhancement pattern. 2.   Mild to moderate generalized cortical atrophy, progressed compared to the 06/30/2014 MRI. 3.   Scattered T2/FLAIR hyperintense foci consistent with chronic microvascular ischemic change, progressed compared to the 2016 MRI. 4.   Few foci of susceptibility consistent with chronic microhemorrhage in the cerebellum and right parietal lobe.  None of these were present on the 2016 MRI.  Such a small number with this pattern could be related to hypertension and less likely very early cerebral amyloid angiopathy. 5.   Nonenhancing focus in the left parotid gland, smaller in size compared to the 2016 MRI.      INTERPRETING PHYSICIAN:  Richard A. Epimenio Foot, MD, PhD, FAAN                              [phone]  Certified in   Neuroimaging by American Society of Neuroimaging  Lab Results  Component Value Date   WBC 7.0 05/06/2022   HGB 13.3 05/06/2022   HCT 42.0 05/06/2022   MCV 82.8 05/06/2022   PLT 245 05/06/2022      Component Value Date/Time   NA 140 05/06/2022 2000   K 3.3 (L) 05/06/2022 2000   CL 109 05/06/2022 2000   CO2 24 05/06/2022 2000   GLUCOSE 107 (H) 05/06/2022 2000   BUN 9 05/06/2022 2000   CREATININE 1.17 05/06/2022 2000   CALCIUM 9.2 05/06/2022 2000   PROT 6.9 02/24/2019 0229   ALBUMIN 2.6 (L) 02/24/2019 0229   AST 36 02/24/2019 0229   ALT 30 02/24/2019 0229   ALKPHOS 63 02/24/2019 0229   BILITOT 1.0 02/24/2019 0229   GFRNONAA >60 05/06/2022 2000   GFRAA >60 02/27/2019 0400   Lab Results  Component Value Date   TRIG 203 (H) 02/21/2019   No results found for: "HGBA1C" Lab Results  Component Value Date   VITAMINB12 1,381 (H) 05/06/2021   Lab Results  Component Value Date   TSH 0.315 (L) 05/06/2021    PHYSICAL EXAM:  Today's Vitals   05/22/22 1114  BP: (!) 146/91  Pulse: (!) 53  Weight: 187 lb 9.6 oz (85.1 kg)  Height: 6\' 1"  (1.854 m)   Body mass index  is 24.75 kg/m.   Wt Readings from Last 3 Encounters:  05/22/22 187 lb 9.6 oz (85.1 kg)  05/06/22 204 lb (92.5 kg)  11/07/21 204 lb (92.5 kg)     Ht Readings from Last 3 Encounters:  05/22/22 6\' 1"  (1.854 m)  05/06/22 6\' 1"  (1.854 m)  11/07/21 6\' 1"  (1.854 m)      General: The patient is awake, alert and appears not in acute distress. The patient is well groomed. Head: Normocephalic, atraumatic. Neck is supple.  Mallampati 3, neck circumference:16.75" Cardiovascular:  Regular rate and rhythm, without  murmurs or carotid bruit, and without distended neck veins. Respiratory: Lungs are clear to auscultation. Skin:  Without evidence of edema, or rash Trunk: BMI is 26.3 , normal posture.   Neurologic exam : The patient is awake and alert, but distractible oriented to place and time.  Memory subjective  impaired  described as progressively worsening.  There is no normal attention span & concentration ability.      05/22/2022   11:30 AM 05/06/2021    9:47 AM  MMSE - Mini Mental State Exam  Orientation to time 4 5  Orientation to Place 4 4  Registration 3 3  Attention/ Calculation 2 3  Recall 0 0  Language- name 2 objects 2 2  Language- repeat 1 1  Language- follow 3 step command 3 3  Language- read & follow direction 1 1  Write a sentence 1 1  Copy design 1 0  Total score 22 23            05/06/2021    9:47 AM  MMSE - Mini Mental State Exam  Orientation to time 5  Orientation to Place 4  Registration 3  Attention/ Calculation WORLD backwards- 5  Recall 0  Language- name 2 objects 2  Language- repeat 1  Language- follow 3 step command 3  Language- read & follow direction 1  Write a sentence 1  Copy design 0  Total score 25/ 30           05/06/2021    9:44 AM  Montreal Cognitive Assessment   Visuospatial/ Executive (0/5) 2  Naming (0/3) 3  Attention: Read list of digits (0/2) 1  Attention: Read list of letters (0/1) 1  Attention: Serial 7 subtraction starting at 100 (0/3) 3  Language: Repeat phrase (0/2) 0  Language : Fluency (0/1) 1  Abstraction (0/2) 2  Delayed Recall (0/5) 0  Orientation (0/6) 6  Total 19    Speech is not fluent with mild dysphonia and aphasia. Mood and affect are interested,  not anxious. Conversant.    Cranial nerves: Pupils are equal and briskly reactive to light. Funduscopic exam without  evidence of pallor or edema. Extraocular movements  in vertical and horizontal planes intact and without nystagmus.  No skipping.  Visual fields by finger perimetry are intact. Hearing to finger rub intact.   Facial sensation intact to fine touch.  Facial motor strength is symmetric and tongue and uvula move midline.  Tongue protrusion into either cheek is normal. Shoulder shrug is normal.    Motor exam:   Normal tone , low muscle bulk and  symmetric strength in upper extremities.he is unable to hip flex , right more impaired/ weaker than left.  Grip strength was checked repeatedly and was weak bilaterally.  He did not provide a full grip, more a pinch of the right hands thumb and index finger - smaller and soft thenar eminence.  Sensory:  Fine touch and vibration were tested in all extremities. Proprioception was normal.    Coordination: Rapid alternating movements in the fingers/hands were normal.  Finger-to-nose maneuver  normal and prompt without evidence of ataxia, dysmetria or tremor.   Gait and station: Patient walks without assistive device . No falls.  Proximal and distal strength within normal limits. Stance is stable.  Tandem gait is unfragmented.  Romberg testing is negative    Deep tendon reflexes: in the  upper and lower extremities are symmetric   Babinski maneuver response is down-going.        ASSESSMENT AND PLAN 77 y.o. year old male  here with:    1) visual hallucinations, silent visions of people in his home, in his car. No communications but for him very realistic. He  has called the police. He was seen in ED and by PCP Dr Wylene Simmer.  ( sleep apnea is not of concern today-  he denies visions in his sleep or dream enactment.)   2)  This patient has DM2 vasculopathy, CKD, HTN and AV block, there is also an unspecified degree of cerebrovascular disease.  Most recent laboratory tests were from 05/08/2022 and included a urine analysis but his last complete metabolic panel that I could access was from November 2023 the same for lipid panel, PSA testosterone was low hemoglobin A1c at the time was 6.8, Hemoccult test was negative, fasting blood glucose seems to be elevated for several of his last lab appointments.  TSH was 0.25 and low. MRI brain last year showed brain atrophy.   30 His daughter is here with him, but is not aware of details of her fathers health history, she lived out of state and until 2021  there was her stepmother who managed her fathers life, no deficits were known at the time. He has no personal or family cancer history ,no substance abuse hx,  no TBI history. Maternal family history of dementia affecting his mother ( who was an alcohol abuser) , his maternal uncle and cousin.   PLAN :  no recent labs- will order a [facility] neuropathy and dementia panel, MRI brain. ATN. Referral to neuropsychology.   4) we discussed anti-hallucination medications: he lives alone , he sleeps alone. I mentioned Seroquel- These medications should be handled by a person other than the patient. In case of Lewy body dementia, there can be severe parkinsonism arising.  I like to wait until we have labs and MRI report back.   I plan to follow up either personally or through our NP within 4-5 months.   I would like to thank Tisovec, Adelfa Koh, MD and Hervey Ard, Pa-c [address] [place],  [place] [address] for allowing me to meet with and to take care of this pleasant patient.   CC: I will share my notes with PCP .  After spending a total time of  50  minutes face to face and additional time for physical and neurologic examination, review of laboratory studies,  personal review of imaging studies, reports and results of other testing and review of referral information / records as far as provided in visit,   Electronically signed by: Melvyn Novas, MD 05/22/2022 11:43 AM  [facility] Board certified by The [employer] of Sleep Medicine and Diplomate of the [employer] of Sleep Medicine. Board certified In Neurology through the ABPN, Fellow of the [employer] of Neurology. Medical Director of [facility].

## 2022-05-22 NOTE — Patient Instructions (Signed)
Lewy Body Dementia Lewy body dementia, also called dementia with Lewy bodies, is a condition that affects the [place] the brain functions. It is one form of dementia. In this condition, proteins called Lewy bodies build up in certain areas of the brain. This causes problems with: Memory. Decision making. Behavior. Speaking. Thinking and problem solving. Movements and balance. This condition is progressive, which means that it gets worse with time (is degenerative) and cannot be reversed. What are the causes? This condition is caused by the buildup of Lewy bodies in brain cells in areas of the brain that control memory, thinking, and movement. It is not known what causes the Lewy bodies to build up. What increases the risk? You are more likely to develop this condition if you: Have a family history of Lewy body dementia or Parkinson's disease. Are 47 years old or older. Are male. What are the signs or symptoms? Symptoms of this condition may include: Seeing things that are not there (hallucinating). Sleep problems, such as acting out dreams while you are asleep. Changes in memory, attention, and concentration that come and go (fluctuate). Symptoms of dementia, such as: Trouble with memory. Trouble paying attention. Problems with planning and organizing. Problems with judgment. Behavioral problems. Symptoms of Parkinson's disease, such as: Shaking movements that you cannot control (tremor). Tremors usually start in a hand or foot when you are resting (resting tremor). Stooped posture. Slowing of movement. Stiff muscles (rigidity). Loss of balance and stability when standing. How is this diagnosed? This condition is diagnosed by a specialist who diagnoses and treats this condition (neurologist). Your health care provider will talk with you and your family, friends, or caregivers about your history and symptoms. A thorough medical history will be taken, and you will have a physical exam  and tests. Tests may include: Lab tests, such as blood or urine tests. Imaging tests, such as a CT scan, a PET scan, or an MRI. A test that involves removing and testing a small amount of the fluid that surrounds the brain and spinal cord (lumbar puncture). Tests that evaluate brain function, such as memory tests, cognitive tests, and neuropsychological tests. How is this treated? There is no cure for this condition. Treatment focuses on managing your symptoms. Treatment may include: Medicines to help with hallucinations, sleep, and behavioral problems. Speech, occupational, and physical therapy. Your health care provider can help direct you to support groups, organizations, and other health care providers who can help with decisions about your care. Follow these instructions at home: Medicines Take over-the-counter and prescription medicines only as told by your health care provider. To help you manage your medicines, use a pill organizer or pill reminder. Avoid taking medicines that can affect thinking, such as pain medicines or certain sleeping medicines. Always check with your health care provider before taking any new medicines. Lifestyle Make healthy lifestyle choices: Be physically active as told by your health care provider. Do not use any products that contain nicotine or tobacco, such as cigarettes, e-cigarettes, and chewing tobacco. If you need help quitting, ask your health care provider. Try to practice stress-management techniques when you experience stress, such as mindfulness, yoga, or deep breathing. Stay socially connected. Talk regularly with other people, such as family, friends, and neighbors. Make sure you sleep well. These tips can help you get a good night's rest: Avoid napping during the day. Keep your sleeping area dark and cool. Avoid exercising a few hours before you go to bed. Avoid caffeine products in the  evening. Eating and drinking Do not drink  alcohol. Drink enough fluid to keep your urine pale yellow. Eat a healthy diet. Safety  Work with your health care provider to determine what you need help with and what your safety needs are. If you have trouble moving around, use a cane or walker as told by your health care provider. Make sure your home environment is safe. To do this: Remove things that can be a tripping hazard, such as throw rugs or clutter. Install grab bars and railings in your home to prevent falls. Talk with your health care provider about whether it is safe for you to drive. If you were given a bracelet that identifies you as a person with memory loss or tracks your location, make sure to wear it at all times. General instructions  Work with your family to make important decisions, such as advance directives, medical power of attorney, or a living will. Keep all follow-up visits. This is important. Where to find more information [employer] of Neurological Disorders and Stroke: [employer] Contact a health care provider if you have: New or worsening confusion. New or worsening trouble with sleeping or increased daytime sleepiness. Problems with choking or swallowing. Get help right away if: You feel depressed or sad, or feel that you want to harm yourself. Your family members become concerned for your safety. If you ever feel like you may hurt yourself or others, or have thoughts about taking your own life, get help right away. Go to your nearest emergency department or: Call your local emergency services (911 in the [place] Call a suicide crisis helpline, such as the National Suicide Prevention Lifeline at 605-713-0949 or 988 in the [place] This is open 24 hours a day in the [place] Text the Crisis Text Line at (502)286-0111 (in the [place] Summary Lewy body dementia is a condition that affects the way the brain functions. It causes problems with functions such as memory and thinking. Lewy body dementia gets  worse with time. This condition is caused by the buildup of proteins called Lewy bodies in brain cells. It is not known what causes the Lewy bodies to build up. Work with your health care provider to determine what you need help with and what your safety needs are. Your health care provider can help direct you to support groups, organizations, and other health care providers who can help with decisions about your care. This information is not intended to replace advice given to you by your health care provider. Make sure you discuss any questions you have with your health care provider. Document Revised: 08/01/2020 Document Reviewed: 05/23/2019 Elsevier Patient Education  2023 [employer]vier Inc. Management of Memory Problems  There are some general things you can do to help manage your memory problems.  Your memory may not in fact recover, but by using techniques and strategies you will be able to manage your memory difficulties better.  1)  Establish a routine. Try to establish and then stick to a regular routine.  By doing this, you will get used to what to expect and you will reduce the need to rely on your memory.  Also, try to do things at the same time of day, such as taking your medication or checking your calendar first thing in the morning. Think about think that you can do as a part of a regular routine and make a list.  Then enter them into a daily planner to remind you.  This will help you establish  a routine.  2)  Organize your environment. Organize your environment so that it is uncluttered.  Decrease visual stimulation.  Place everyday items such as keys or cell phone in the same place every day (ie.  Basket next to front door) Use post it notes with a brief message to yourself (ie. Turn off light, lock the door) Use labels to indicate where things go (ie. Which cupboards are for food, dishes, etc.) Keep a notepad and pen by the telephone to take messages  3)  Memory Aids A diary or  journal/notebook/daily planner Making a list (shopping list, chore list, to do list that needs to be done) Using an alarm as a reminder (kitchen timer or cell phone alarm) Using cell phone to store information (Notes, Calendar, Reminders) Calendar/White board placed in a prominent position Post-it notes  In order for memory aids to be useful, you need to have good habits.  It's no good remembering to make a note in your journal if you don't remember to look in it.  Try setting aside a certain time of day to look in journal.  4)  Improving mood and managing fatigue. There may be other factors that contribute to memory difficulties.  Factors, such as anxiety, depression and tiredness can affect memory. Regular gentle exercise can help improve your mood and give you more energy. Simple relaxation techniques may help relieve symptoms of anxiety Try to get back to completing activities or hobbies you enjoyed doing in the past. Learn to pace yourself through activities to decrease fatigue. Find out about some local support groups where you can share experiences with others. Try and achieve 7-8 hours of sleep at night.

## 2022-05-23 LAB — CBC WITH DIFFERENTIAL/PLATELET
Basophils Absolute: 0 10*3/uL (ref 0.0–0.2)
Basos: 1 %
EOS (ABSOLUTE): 0 10*3/uL (ref 0.0–0.4)
Eos: 0 %
Hematocrit: 43.8 % (ref 37.5–51.0)
Hemoglobin: 14.3 g/dL (ref 13.0–17.7)
Immature Grans (Abs): 0 10*3/uL (ref 0.0–0.1)
Immature Granulocytes: 0 %
Lymphocytes Absolute: 1.6 10*3/uL (ref 0.7–3.1)
Lymphs: 28 %
MCH: 26.7 pg (ref 26.6–33.0)
MCH: 25.2 pg — ABNORMAL LOW (ref 26.6–33.0)
MCHC: 32.6 g/dL (ref 31.5–35.7)
MCV: 82 fL (ref 79–97)
Monocytes Absolute: 0.4 10*3/uL (ref 0.1–0.9)
Monocytes: 7 %
Neutrophils Absolute: 3.7 10*3/uL (ref 1.4–7.0)
Neutrophils: 64 %
Neutrophils: 63 %
Platelets: 303 10*3/uL (ref 150–450)
RBC: 5.36 x10E6/uL (ref 4.14–5.80)
RDW: 14.7 % (ref 11.6–15.4)
WBC: 5.8 10*3/uL (ref 3.4–10.8)

## 2022-05-23 LAB — SJOGREN'S SYNDROME ANTIBODS(SSA + SSB): ENA SSB (LA) Ab: <0.2 AI (ref 0.0–0.9)

## 2022-05-23 LAB — RPR: RPR Ser Ql: NONREACTIVE

## 2022-05-23 LAB — COMPREHENSIVE METABOLIC PANEL
Albumin/Globulin Ratio: 1.6 (ref 1.2–2.2)
eGFR: 59 mL/min/{1.73_m2} — ABNORMAL LOW (ref >59)

## 2022-05-23 LAB — MULTIPLE MYELOMA PANEL, SERUM

## 2022-05-23 LAB — TSH: TSH: 0.28 u[IU]/mL — ABNORMAL LOW (ref 0.450–4.500)

## 2022-05-23 LAB — ATN PROFILE

## 2022-05-23 LAB — SEDIMENTATION RATE: Sed Rate: 2 mm/hr (ref 0–30)

## 2022-05-24 LAB — CBC WITH DIFFERENTIAL/PLATELET
EOS (ABSOLUTE): 0 10*3/uL (ref 0.0–0.4)
Immature Granulocytes: 0 %
Lymphocytes Absolute: 1.7 10*3/uL (ref 0.7–3.1)
MCV: 82 fL (ref 79–97)
Monocytes Absolute: 0.4 10*3/uL (ref 0.1–0.9)
Monocytes: 7 %
Platelets: 308 10*3/uL (ref 150–450)

## 2022-05-24 LAB — ATN PROFILE

## 2022-05-24 LAB — COMPREHENSIVE METABOLIC PANEL
Calcium: 10.3 mg/dL — ABNORMAL HIGH (ref 8.6–10.2)
Glucose: 100 mg/dL — ABNORMAL HIGH (ref 70–99)
Sodium: 144 mmol/L (ref 134–144)

## 2022-05-24 LAB — MULTIPLE MYELOMA PANEL, SERUM

## 2022-05-24 LAB — HEPATITIS B SURFACE ANTIGEN: Hepatitis B Surface Ag: NEGATIVE

## 2022-05-24 LAB — HOMOCYSTEINE: Homocysteine: 14.5 umol/L (ref 0.0–19.2)

## 2022-05-25 NOTE — Progress Notes (Signed)
Normal red blood cell count, but now with low [facility]- and this was not the case in previous blood cell counts. This still can point to iron deficiency or bone marrow disease. High calcium levels- this is too high and can promote kidney stones. Normal alkaline phosphatase ( Calcium not likely released from the bone/ does he take a supplement ?) .  TSH - low, at 0.28, and needs to be addressed - a thyroid hyperfunction can influence mental and cognitive Health.  HbA1c was too high , 7.1 %- not well controlled Diabetes.  His creatinine has been higher than any time before in the last 3 years ( compared on EPIC records only)  - but he was well hydrated according to BUN.   Normal liver function tests. Normal Vitamin B 12 and normal homocysteine level. Normal CRP,  Sed rate, negative Hepatitis B screen.   There are still tests pending at this time.    I will share the results with Dr. Wylene Simmer.

## 2022-05-26 ENCOUNTER — Telehealth: Payer: Self-pay | Admitting: Neurology

## 2022-05-26 LAB — CBC WITH DIFFERENTIAL/PLATELET
Hemoglobin: 14.4 g/dL (ref 13.0–17.7)
Immature Grans (Abs): 0 10*3/uL (ref 0.0–0.1)
Neutrophils Absolute: 3.9 10*3/uL (ref 1.4–7.0)
WBC: 6 10*3/uL (ref 3.4–10.8)

## 2022-05-26 LAB — COMPREHENSIVE METABOLIC PANEL
AST: 15 IU/L (ref 0–40)
Albumin: 4.4 g/dL (ref 3.8–4.8)
Alkaline Phosphatase: 90 IU/L (ref 44–121)
CO2: 23 mmol/L (ref 20–29)

## 2022-05-26 LAB — MULTIPLE MYELOMA PANEL, SERUM

## 2022-05-26 LAB — ATN PROFILE

## 2022-05-26 NOTE — Telephone Encounter (Signed)
[facility] medicare/tricare NPR sent to GI [phone]

## 2022-05-27 LAB — HEMOGLOBIN A1C: Hgb A1c MFr Bld: 7.1 % — ABNORMAL HIGH (ref 4.8–5.6)

## 2022-05-27 LAB — COMPREHENSIVE METABOLIC PANEL
BUN/Creatinine Ratio: 6 — ABNORMAL LOW (ref 10–24)
BUN: 8 mg/dL (ref 8–27)
Bilirubin Total: 0.5 mg/dL (ref 0.0–1.2)
Creatinine, Ser: 1.28 mg/dL — ABNORMAL HIGH (ref 0.76–1.27)
Potassium: 4.8 mmol/L (ref 3.5–5.2)
Total Protein: 7.2 g/dL (ref 6.0–8.5)

## 2022-05-27 LAB — CBC WITH DIFFERENTIAL/PLATELET
Basos: 1 %
Hematocrit: 46.6 % (ref 37.5–51.0)
RDW: 15 % (ref 11.6–15.4)

## 2022-05-27 LAB — MULTIPLE MYELOMA PANEL, SERUM

## 2022-05-27 LAB — ATN PROFILE: A -- Beta-amyloid 42/40 Ratio: 0.099 — ABNORMAL LOW (ref >0.102)

## 2022-05-27 LAB — VITAMIN B12: Vitamin B-12: 748 pg/mL (ref 232–1245)

## 2022-05-27 LAB — ANA W/REFLEX: ANA Titer 1: NEGATIVE

## 2022-05-27 LAB — SJOGREN'S SYNDROME ANTIBODS(SSA + SSB): ENA SSA (RO) Ab: <0.2 AI (ref 0.0–0.9)

## 2022-05-28 ENCOUNTER — Telehealth: Payer: Self-pay | Admitting: Neurology

## 2022-05-28 LAB — COMPREHENSIVE METABOLIC PANEL
ALT: 17 IU/L (ref 0–44)
Chloride: 106 mmol/L (ref 96–106)
Globulin, Total: 2.8 g/dL (ref 1.5–4.5)

## 2022-05-28 LAB — CBC WITH DIFFERENTIAL/PLATELET
Basophils Absolute: 0 10*3/uL (ref 0.0–0.2)
Eos: 0 %
Lymphs: 29 %
MCHC: 30.9 g/dL — ABNORMAL LOW (ref 31.5–35.7)
RBC: 5.71 x10E6/uL (ref 4.14–5.80)

## 2022-05-28 LAB — HEMOGLOBIN A1C: Est. average glucose Bld gHb Est-mCnc: 157 mg/dL

## 2022-05-28 LAB — ATN PROFILE: Beta-amyloid 40: 184.66 pg/mL

## 2022-05-28 LAB — MULTIPLE MYELOMA PANEL, SERUM
Alpha 1: 0.2 g/dL (ref 0.0–0.4)
IgA/Immunoglobulin A, Serum: 330 mg/dL (ref 61–437)
M Protein SerPl Elph-Mcnc: 0.2 g/dL — ABNORMAL HIGH

## 2022-05-28 LAB — C-REACTIVE PROTEIN: CRP: <1 mg/L (ref 0–10)

## 2022-05-28 NOTE — Telephone Encounter (Signed)
-----   Message from Melvyn Novas, MD sent at 05/25/2022  6:17 PM EDT ----- Normal red blood cell count, but now with low [facility]- and this was not the case in previous blood cell counts. This still can point to iron deficiency or bone marrow disease. High calcium levels- this is too high and can promote kidney stones. Normal alkaline phosphatase ( Calcium not likely released from the bone/ does he take a supplement ?) .  TSH - low, at 0.28, and needs to be addressed - a thyroid hyperfunction can influence mental and cognitive Health.  HbA1c was too high , 7.1 %- not well controlled Diabetes.  His creatinine has been higher than any time before in the last 3 years ( compared on EPIC records only)  - but he was well hydrated according to BUN.   Normal liver function tests. Normal Vitamin B 12 and normal homocysteine level. Normal CRP,  Sed rate, negative Hepatitis B screen.   There are still tests pending at this time.    I will share the results with Dr. Wylene Simmer.

## 2022-05-28 NOTE — Telephone Encounter (Signed)
Called the patient's daughter and reviewed the lab results with her. Advised of the findings and the concerns that Dr Vickey Huger had. Informed I would make sure that Dr Wylene Simmer gets access to this information and we will also mail a copy of the lab results to the patient. She was appreciative for the call. He has a upcoming apt scheduled with PCP.

## 2022-05-31 ENCOUNTER — Ambulatory Visit (HOSPITAL_COMMUNITY)
Admission: EM | Admit: 2022-05-31 | Discharge: 2022-05-31 | Disposition: A | Discharge status: Home / Self Care | Payer: TRICARE For Life (TFL)

## 2022-05-31 DIAGNOSIS — F0280 Dementia in other diseases classified elsewhere without behavioral disturbance: Secondary | ICD-10-CM

## 2022-05-31 DIAGNOSIS — G3183 Dementia with Lewy bodies: Secondary | ICD-10-CM

## 2022-05-31 NOTE — ED Provider Notes (Signed)
Behavioral Health Urgent Care Medical Screening Exam  Patient Name: Jerry Caldwell. MRN: [id number] Date of Evaluation: 05/31/22 Chief Complaint: "thinking" Diagnosis:  Final diagnoses:  Lewy body dementia, unspecified dementia severity, unspecified whether behavioral, psychotic, or mood disturbance or anxiety (HCC)   History of Present illness: Jerry Caldwell. is a 77 y.o. male. Pt presents voluntarily to [facility] behavioral health for walk-in assessment. Pt arrives with police escort. Pt is assessed face-to-face by nurse practitioner.   Jerry Day., 77 y.o., male patient seen face to face by this provider, consulted with Dr. Viviano Simas; and chart reviewed on 05/31/22. Pt w/ history of osa (on cpap), dm2 vasculopathy, ckd, htn, av block, unspecified degree of cerebrovascular disease.   On evaluation Jerry Lenhard. reports presenting today because of "thinking". Pt reports tomorrow is mother's day and at church the males are expected to arrange an event. He states he was at home today thinking about all the things that needed to be done when he became overwhelmed. He states he called his friend and his friend called police who brought him here.   He denies suicidal, homicidal or violent ideations. He denies auditory visual hallucinations or paranoia. He states he does occasionally experience visual hallucinations of children in his home that run in the cracks in his walls. He states when he gets closer they disappear. This has been ongoing for several months.  Pt denies alcohol, nicotine, marijuana, crack/cocaine, other substance use.  Pt denies history of non suicidal self injurious behavior, suicide attempt or inpatient psychiatric hospitalization.  Pt reports family history is positive for dementia.   Pt denies prior psychiatric history.   Pt is working as a crossing guard.   Pt reports he is living alone. He states his wife passed away in 11-Feb-2019.   Pt denies  access to a firearm or other weapon.  Pt reports he is going to go home and rest, plans on going to church tomorrow. Discussed following up with neurology for medication adjustments. He verbalizes understanding and agrees to do so. Also discussed psychiatry and counseling resources available to pt. Pt can also call the number on the back of his insurance card for further care coordination.  Flowsheet Row ED from 05/31/2022 in [facility] ED from 05/06/2022 in [facility] Emergency Department at [facility]  C-SSRS RISK CATEGORY No Risk No Risk       Psychiatric Specialty Exam  Presentation  General Appearance:Appropriate for Environment; Casual; Fairly Groomed  Eye Contact:Fair  Speech:Clear and Coherent; Normal Rate  Speech Volume:Normal  Handedness:Right   Mood and Affect  Mood:Euthymic  Affect:Blunt; Constricted   Thought Process  Thought Processes:Coherent; Goal Directed; Linear  Descriptions of Associations:Intact  Orientation:Full (Time, Place and Person)  Thought Content:Logical  Diagnosis of Schizophrenia or Schizoaffective disorder in past: No  Duration of Psychotic Symptoms: Less than six months  Hallucinations:None  Ideas of Reference:None  Suicidal Thoughts:No  Homicidal Thoughts:No   Sensorium  Memory:Immediate Fair  Judgment:Fair  Insight:Fair   Executive Functions  Concentration:Fair  Attention Span:Fair  Recall:Fair  Fund of Knowledge:Fair  Language:Fair   Psychomotor Activity  Psychomotor Activity:Shuffling Gait   Assets  Assets:Communication Skills; Desire for Improvement; Financial Resources/Insurance; Housing; Leisure Time; Physical Health; Resilience; Social Support; Vocational/Educational   Sleep  Sleep:Fair  Number of hours: 6   Physical Exam: Physical Exam Constitutional:      General: He is not in acute distress.    Appearance: He is not  ill-appearing, toxic-appearing or  diaphoretic.  Eyes:     General: No scleral icterus. Cardiovascular:     Rate and Rhythm: Bradycardia present.  Pulmonary:     Effort: Pulmonary effort is normal. No respiratory distress.  Skin:    General: Skin is warm and dry.  Neurological:     Mental Status: He is alert and oriented to person, place, and time.  Psychiatric:        Attention and Perception: Attention and perception normal.        Mood and Affect: Mood normal. Affect is blunt.        Speech: Speech normal.        Behavior: Behavior normal. Behavior is cooperative.        Thought Content: Thought content normal.    Review of Systems  Constitutional:  Negative for chills and fever.  Respiratory:  Negative for shortness of breath.   Cardiovascular:  Negative for chest pain and palpitations.  Gastrointestinal:  Negative for abdominal pain.  Neurological:  Negative for headaches.   Blood pressure (!) 176/94, pulse (!) 55, temperature 97.6 F (36.4 C), temperature source Oral, resp. rate 20, SpO2 100 %. There is no height or weight on file to calculate BMI.  Musculoskeletal: Strength & Muscle Tone: within normal limits Gait & Station: normal Patient leans: N/A   [facility] MSE Discharge Disposition for Follow up and Recommendations: Based on my evaluation the patient does not appear to have an emergency medical condition and can be discharged with resources and follow up care in outpatient services for Medication Management and Individual Therapy   Lauree Chandler, NP 05/31/2022, 2:10 PM

## 2022-05-31 NOTE — Progress Notes (Signed)
   05/31/22 1234  [facility] Triage Screening (Walk-ins at [facility] only)  How Did You Hear About [place]? DSS  What Is the Reason for Your Visit/Call Today? Jerry Caldwell presented voluntarily  to [facility].  He was brought in by [employer].  Patient said that he was at home and started having problime in thinking about things he need to do and called his friend who after talking with patient decided to call [employer] to have patinet brought in for an evaluation.  Patient denis having SI/HI.  He  reports seeing people adults and children that he does not know in his house.  He reports that they leave and go under a wall whenever he tries to talk to them.  Patient siad that he no longer drinks alcohol and heas never done any other type of substances.  How Long Has This Been Causing You Problems? 1 wk - 1 month  Have You Recently Had Any Thoughts About Hurting Yourself? No  Are You Planning to Commit Suicide/Harm Yourself At This time? No  Have you Recently Had Thoughts About Hurting Someone [name]? No  Are You Planning To Harm Someone At This Time? No  Are you currently experiencing any auditory, visual or other hallucinations? Yes  Please explain the hallucinations you are currently experiencing: Patient reports seeing people in his home.  He tries to talk to them asking them to leave but they do not respond.  Have You Used Any Alcohol or Drugs in the Past 24 Hours? No  Do you have any current medical co-morbidities that require immediate attention? No  Clinician description of patient physical appearance/behavior: casually dressed. coopperative  What Do You Feel Would Help You the Most Today? Treatment for Depression or other mood problem  If access to [facility] was not available, would you have sought care in the Emergency Department? No  Determination of Need Routine (7 days)  Options For Referral Outpatient Therapy;Medication Management;BH Urgent Care

## 2022-05-31 NOTE — Discharge Instructions (Signed)

## 2022-06-25 ENCOUNTER — Telehealth: Payer: Self-pay | Admitting: Internal Medicine

## 2022-06-25 ENCOUNTER — Inpatient Hospital Stay: Payer: TRICARE For Life (TFL)

## 2022-06-25 ENCOUNTER — Inpatient Hospital Stay: Payer: TRICARE For Life (TFL) | Admitting: Physician Assistant

## 2022-06-25 NOTE — Telephone Encounter (Signed)
Sounds urgent to me.  However, you will need to call him and get more history. If he is actively bleeding, he needs to go to the hospital. Otherwise, he needs to be seen in the office ASAP.  As you know, I have ZERO office openings until July. Work him in with one of the advanced practitioners ASAP (exhaust this option first) or a provider with an opening. Get all relevant outside records. Thanks, Dr. Marina Goodell

## 2022-06-25 NOTE — Telephone Encounter (Signed)
Spoke with pts daughter and she will need to check with her father regarding if he is still having black tarry stools. Discussed with her we should have some appts available for next week tomorrow and we will call her back. Discussed with her if he has any SOB, dizziness, increased weakness, active bleeding she would need to take him to the ER . Daughter verbalized understanding.

## 2022-06-25 NOTE — Telephone Encounter (Signed)
Urgent referral in [place] from PCP for abd pain and dark tarry stools.  Last saw Dr. Marina Goodell in 2018.  Please review and advise urgency and scheduling.  Thanks

## 2022-06-26 NOTE — Telephone Encounter (Signed)
Pt scheduled to see Willette Cluster NP 07/03/22 at 8:30am. Please notify pts daughter of appt.

## 2022-06-27 ENCOUNTER — Ambulatory Visit
Admission: RE | Admit: 2022-06-27 | Discharge: 2022-06-27 | Disposition: A | Discharge status: Home / Self Care | Payer: TRICARE For Life (TFL) | Source: Ambulatory Visit | Attending: Neurology | Admitting: Neurology

## 2022-06-27 DIAGNOSIS — R441 Visual hallucinations: Secondary | ICD-10-CM | POA: Diagnosis not present

## 2022-06-27 DIAGNOSIS — R413 Other amnesia: Secondary | ICD-10-CM

## 2022-06-27 DIAGNOSIS — G3183 Dementia with Lewy bodies: Secondary | ICD-10-CM

## 2022-06-27 MED ORDER — GADOPICLENOL 0.5 MMOL/ML IV SOLN
8.0000 mL | Freq: Once | INTRAVENOUS | Status: AC | PRN
  Administered 2022-06-27: 8 mL via INTRAVENOUS

## 2022-06-30 NOTE — Telephone Encounter (Signed)
PT daughter is calling about the appointment scheduled on 6/13. She lives in [place], [place] and she doesn't think she will be able to get him there. She wants to discuss other options like the 19th. Please advise.

## 2022-06-30 NOTE — Telephone Encounter (Signed)
Let daughter know we will call her back on Wednesday when the appts open up on 6/19. She would like around 10am if possible.

## 2022-07-01 ENCOUNTER — Telehealth: Payer: Self-pay

## 2022-07-01 DIAGNOSIS — F0152 Vascular dementia, unspecified severity, with psychotic disturbance: Secondary | ICD-10-CM

## 2022-07-01 DIAGNOSIS — F0280 Dementia in other diseases classified elsewhere without behavioral disturbance: Secondary | ICD-10-CM

## 2022-07-01 DIAGNOSIS — G3183 Dementia with Lewy bodies: Secondary | ICD-10-CM

## 2022-07-01 NOTE — Telephone Encounter (Signed)
-----   Message from Melvyn Novas, MD sent at 06/30/2022 10:09 AM EDT ----- Overall unchanged MRI of the brain, chronic microvascular changes and chronic micro- hemorrhages.  No acute findings. This MRI result cannot localize cognitive decline to a certain brain area, a PET/ DAT scan may follow to evaluate for amyloid angiopathy. Discussion in follow up visit.
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
## 2022-07-02 NOTE — Telephone Encounter (Signed)
Spoke to patient's daughter, Joni Reining to advise that we will see patient on 07/09/22 at 10:00 am for follow up of abdominal pain, tarry stools. Nicole verbalizes understanding. She does state that she found out her father had been taking pepto bismol for abdominal pain and she also thinks he was eating "old food." She states that since she has had him at her home in [place] recently, she has been preparing his food and he is no longer complaining of pain/tarry stool. I did advise that they go ahead and come for appointment though just to be sure there is nothing additional to be done.

## 2022-07-03 ENCOUNTER — Ambulatory Visit: Payer: TRICARE For Life (TFL) | Admitting: Nurse Practitioner

## 2022-07-04 MED ORDER — DONEPEZIL HCL 5 MG PO TABS: 5.0000 mg | ORAL_TABLET | Freq: Every morning | ORAL | 2 refills | Status: DC

## 2022-07-04 NOTE — Telephone Encounter (Signed)
An unspecific atrophy by Brain MRI doesn't rule out dementia- its just not helpful in differentiating between forms of dementia. He has some amyloid vasculopathy indications, so I will order the amyloid MRI / PET scan. While Lewy body is high on the list by clinical symptoms, it is possible that  Vascular dementia overlaps with it .   He should not be driving, given the information you shared with [place].   Medication is same as in Alzheimer's dementia, usually starting with Aricept.  Nocturnal and visual hallucinations can be treated with psychopharmacologics, but all of these increase the risks of strokes.  We will start with aricept and amyloid  imaging.   Melvyn Novas, MD

## 2022-07-04 NOTE — Addendum Note (Signed)
Addended by: Melvyn Novas on: 07/04/2022 11:20 AM   Modules accepted: Orders

## 2022-07-07 ENCOUNTER — Telehealth: Payer: Self-pay | Admitting: Neurology

## 2022-07-07 NOTE — Progress Notes (Deleted)
07/07/2022 Susa Day [id number] 1945/02/12  Referring provider: Gaspar Garbe, MD Primary GI doctor: Dr. Marina Goodell  ASSESSMENT AND PLAN:   There are no diagnoses linked to this encounter.   Patient Care Team: Tisovec, Adelfa Koh, MD as PCP - General (Internal Medicine)  HISTORY OF PRESENT ILLNESS: 77 y.o. male with a past medical history of OSA on CPAP, type 2 diabetes, CKD, hypertension, AV block, unspecified degree of cerebrovascular disease with vasculopathy and others listed below presents for evaluation of abdominal pain and black tarry stools.   06/17/2016 colonoscopy with Dr. Marina Goodell for screening purposes, excellent prep internal hemorrhoids, diverticula, otherwise unremarkable Recall 10 years. 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  Some visual hallucinations of children in his home that run in the cracks in his walls, was working with coughing guard and living alone, wife passed away 02-Mar-2019. Jul 15, 2022 MRI with and without contrast of the brain for memory changes and hallucinations virtually unchanged other than chronic microvascular changes and chronic microhemorrhages, is scheduled for PET/DAT scan to evaluate for amyloid angiopathy  06/25/2022 urgent referral request from primary care for abdominal pain and dark tarry stools. Has been taking Pepto-Bismol for abdominal pain. Daughter lives in [place]   He {Actions; denies-reports:120008} blood thinner use.  He {Actions; denies-reports:120008} NSAID use.  He {Actions; denies-reports:120008} ETOH use.   He {Actions;  denies-reports:120008} tobacco use.  He {Actions; denies-reports:120008} drug use.    He  reports that he has quit smoking. He has never used smokeless tobacco. He reports that he does not drink alcohol and does not use drugs.  RELEVANT LABS AND IMAGING: CBC    Component Value Date/Time   WBC 5.8 05/22/2022 1309   WBC 7.0 05/06/2022 2000   RBC 5.36 05/22/2022 1309   RBC 5.07 05/06/2022 2000   HGB 14.3 05/22/2022 1309   HCT 43.8 05/22/2022 1309   PLT 303 05/22/2022 1309   MCV 82 05/22/2022 1309   MCH 26.7 05/22/2022 1309   MCH 26.2 05/06/2022 2000   MCHC 32.6 05/22/2022 1309   MCHC 31.7 05/06/2022 2000   RDW 14.7 05/22/2022 1309   LYMPHSABS 1.6 05/22/2022 1309   MONOABS 0.8 02/24/2019 0229   EOSABS 0.0 05/22/2022 1309   BASOSABS 0.0 05/22/2022 1309   Recent Labs    05/06/22 2000 05/22/22 1304 05/22/22 1309  HGB 13.3 14.4 14.3    CMP     Component Value Date/Time   NA 144 05/22/2022 1304   K 4.8 05/22/2022 1304   CL 106 05/22/2022 1304   CO2 23 05/22/2022 1304   GLUCOSE 100 (H) 05/22/2022 1304   GLUCOSE 107 (H) 05/06/2022 2000   BUN 8 05/22/2022 1304   CREATININE 1.28 (H) 05/22/2022 1304   CALCIUM 10.3 (H) 05/22/2022 1304   PROT 7.2 05/22/2022 1304   ALBUMIN 4.4 05/22/2022 1304   AST 15 05/22/2022 1304   ALT 17 05/22/2022 1304   ALKPHOS 90 05/22/2022 1304   BILITOT 0.5 05/22/2022 1304   GFRNONAA >60 05/06/2022 2000   GFRAA >60 02/27/2019 0400      Latest Ref Rng & Units 05/22/2022  1:04 PM 02/24/2019    2:29 AM 02/23/2019    3:07 AM  Hepatic Function  Total Protein 6.0 - 8.5 g/dL 7.2  6.9  7.2   Albumin 3.8 - 4.8 g/dL 4.4  2.6  2.8   AST 0 - 40 IU/L 15  36  27   ALT 0 - 44 IU/L 17  30  22    Alk Phosphatase 44 - 121 IU/L 90  63  62   Total Bilirubin 0.0 - 1.2 mg/dL 0.5  1.0  1.1       Current Medications:   Current Outpatient Medications (Endocrine & Metabolic):    metFORMIN (GLUCOPHAGE) 500 MG tablet, Take 500 mg by mouth daily with  breakfast.  Current Outpatient Medications (Cardiovascular):    atenolol (TENORMIN) 50 MG tablet, Take 50 mg by mouth daily.   COZAAR 50 MG tablet, Take 50 mg by mouth daily.   hydrochlorothiazide (HYDRODIURIL) 25 MG tablet, Take 12.5 mg by mouth daily.  Current Outpatient Medications (Respiratory):    cetirizine (ZYRTEC) 10 MG tablet, Take 10 mg by mouth daily as needed for allergies or rhinitis.    Current Outpatient Medications (Other):    donepezil (ARICEPT) 5 MG tablet, Take 1 tablet (5 mg total) by mouth every morning.   timolol (TIMOPTIC) 0.5 % ophthalmic solution, Place 1 drop into both eyes 2 (two) times daily.  (Patient not taking: Reported on 05/22/2022)  Medical History:  Past Medical History:  Diagnosis Date   Allergy    Arthritis    right knee    Diabetes (HCC)    Diverticulosis    Glaucoma    HTN (hypertension)    Hyperlipidemia    Hypogonadism in male    Migraines    Sleep apnea    uses cpap    Allergies:  Allergies  Allergen Reactions   Ace Inhibitors Cough   Lisinopril Cough   Sildenafil Other (See Comments)    Dizziness, Visual disturbance, Glaucoma     Surgical History:  He  has a past surgical history that includes Anterior cruciate ligament repair (1992); Rotator cuff repair (Right, 2008); and Colonoscopy (03/31/2006). Family History:  His family history includes Cancer - Lung in his mother; Diabetes in his sister; Diabetes type II in his maternal aunt; Glaucoma in his mother.  REVIEW OF SYSTEMS  : All other systems reviewed and negative except where noted in the History of Present Illness.  PHYSICAL EXAM: There were no vitals taken for this visit. General Appearance: Well nourished, in no apparent distress. Head:   Normocephalic and atraumatic. Eyes:  sclerae anicteric,conjunctive pink  Respiratory: Respiratory effort normal, BS equal bilaterally without rales, rhonchi, wheezing. Cardio: RRR with no MRGs. Peripheral pulses intact.  Abdomen:  Soft,  {BlankSingle:19197::"Flat","Obese","Non-distended"} ,active bowel sounds. {actendernessAB:27319} tenderness {anatomy; site abdomen:5010}. {BlankMultiple:19196::"Without guarding","With guarding","Without rebound","With rebound"}. No masses. Rectal: {acrectalexam:27461} Musculoskeletal: Full ROM, {PSY - GAIT AND STATION:22860} gait. {With/Without:304960234} edema. Skin:  Dry and intact without significant lesions or rashes Neuro: Alert and  oriented x4;  No focal deficits. Psych:  Cooperative. Normal mood and affect.    Doree Albee, PA-C 1:17 PM

## 2022-07-07 NOTE — Telephone Encounter (Signed)
no auth required sent to [facility] [phone]

## 2022-07-07 NOTE — Telephone Encounter (Signed)
Contacted Nicole back, informed her of MD recommendations. Advised we will complete PET scan and start him on Aricept 5mg  once daily. Went over results from MRI again for clarification. Advised to call the office back with any other questions and she was appreciative.

## 2022-07-09 ENCOUNTER — Ambulatory Visit: Payer: TRICARE For Life (TFL) | Admitting: Physician Assistant

## 2022-07-09 ENCOUNTER — Inpatient Hospital Stay: Payer: TRICARE For Life (TFL) | Attending: Physician Assistant | Admitting: Hematology and Oncology

## 2022-07-09 ENCOUNTER — Inpatient Hospital Stay: Payer: TRICARE For Life (TFL)

## 2022-07-22 ENCOUNTER — Telehealth: Payer: Self-pay | Admitting: Neurology

## 2022-07-22 NOTE — Telephone Encounter (Signed)
[employer] request authorization for Pet Scan scheduled on Monday.

## 2022-07-23 NOTE — Telephone Encounter (Signed)
I submitted an auth with [facility] medicare and it was sent to medical review case #[id number]

## 2022-07-28 ENCOUNTER — Encounter (HOSPITAL_COMMUNITY): Admission: RE | Admit: 2022-07-28 | Payer: Medicare Other | Source: Ambulatory Visit

## 2022-07-28 ENCOUNTER — Encounter (HOSPITAL_COMMUNITY): Payer: Medicare Other

## 2022-07-28 DIAGNOSIS — F0282 Dementia in other diseases classified elsewhere, unspecified severity, with psychotic disturbance: Secondary | ICD-10-CM | POA: Diagnosis present

## 2022-07-28 DIAGNOSIS — F0280 Dementia in other diseases classified elsewhere without behavioral disturbance: Secondary | ICD-10-CM | POA: Insufficient documentation

## 2022-07-28 DIAGNOSIS — F0152 Vascular dementia, unspecified severity, with psychotic disturbance: Secondary | ICD-10-CM | POA: Insufficient documentation

## 2022-07-28 DIAGNOSIS — G3183 Dementia with Lewy bodies: Secondary | ICD-10-CM | POA: Diagnosis present

## 2022-07-28 MED ORDER — FLORBETAPIR F 18 500-1900 MBQ/ML IV SOLN
9.2290 | Freq: Once | INTRAVENOUS | Status: AC
  Administered 2022-07-28: 9.229 via INTRAVENOUS

## 2022-07-29 ENCOUNTER — Telehealth: Payer: Self-pay

## 2022-07-29 NOTE — Telephone Encounter (Signed)
-----   Message from Melvyn Novas, MD sent at 07/28/2022  5:15 PM EDT ----- Amyloid deposits noted , confirming Alzheimer's disease diagnosis.

## 2022-07-29 NOTE — Telephone Encounter (Signed)
I called patient, I spoke with patient's daughter, Joni Reining, per [facility]. I advised her of the PET scan results. I reminded patient's daughter of the appointment on 10/09/22 at 11:30am. Patient's daughter reports that she does not think patient is taking the donepezil compliantly but will discuss at his appointment. Patient's daughter verbalized understanding of results and had no further questions or concerns.

## 2022-08-28 ENCOUNTER — Emergency Department (HOSPITAL_COMMUNITY)
Admission: EM | Admit: 2022-08-28 | Discharge: 2022-08-30 | Disposition: A | Discharge status: Home / Self Care | Payer: Medicare Other | Attending: Emergency Medicine | Admitting: Emergency Medicine

## 2022-08-28 ENCOUNTER — Emergency Department (HOSPITAL_COMMUNITY): Payer: Medicare Other

## 2022-08-28 ENCOUNTER — Other Ambulatory Visit: Payer: Self-pay

## 2022-08-28 ENCOUNTER — Emergency Department (HOSPITAL_COMMUNITY): Payer: TRICARE For Life (TFL)

## 2022-08-28 DIAGNOSIS — F03A0 Unspecified dementia, mild, without behavioral disturbance, psychotic disturbance, mood disturbance, and anxiety: Secondary | ICD-10-CM | POA: Diagnosis not present

## 2022-08-28 DIAGNOSIS — Z1152 Encounter for screening for COVID-19: Secondary | ICD-10-CM | POA: Diagnosis not present

## 2022-08-28 DIAGNOSIS — E119 Type 2 diabetes mellitus without complications: Secondary | ICD-10-CM | POA: Diagnosis not present

## 2022-08-28 DIAGNOSIS — Z7984 Long term (current) use of oral hypoglycemic drugs: Secondary | ICD-10-CM | POA: Diagnosis not present

## 2022-08-28 DIAGNOSIS — E876 Hypokalemia: Secondary | ICD-10-CM

## 2022-08-28 DIAGNOSIS — I10 Essential (primary) hypertension: Secondary | ICD-10-CM | POA: Insufficient documentation

## 2022-08-28 DIAGNOSIS — R4182 Altered mental status, unspecified: Secondary | ICD-10-CM | POA: Diagnosis not present

## 2022-08-28 DIAGNOSIS — Z79899 Other long term (current) drug therapy: Secondary | ICD-10-CM | POA: Diagnosis not present

## 2022-08-28 LAB — TROPONIN I (HIGH SENSITIVITY): Troponin I (High Sensitivity): 4 ng/L (ref <18)

## 2022-08-28 LAB — COMPREHENSIVE METABOLIC PANEL
ALT: 14 U/L (ref 0–44)
AST: 16 U/L (ref 15–41)
Albumin: 3.7 g/dL (ref 3.5–5.0)
Alkaline Phosphatase: 76 U/L (ref 38–126)
Anion gap: 9 (ref 5–15)
BUN: 12 mg/dL (ref 8–23)
CO2: 26 mmol/L (ref 22–32)
Calcium: 9.6 mg/dL (ref 8.9–10.3)
Chloride: 106 mmol/L (ref 98–111)
Creatinine, Ser: 1.23 mg/dL (ref 0.61–1.24)
GFR, Estimated: >60 mL/min (ref >60)
Glucose, Bld: 90 mg/dL (ref 70–99)
Potassium: 3.3 mmol/L — ABNORMAL LOW (ref 3.5–5.1)
Sodium: 141 mmol/L (ref 135–145)
Total Bilirubin: 0.8 mg/dL (ref 0.3–1.2)
Total Protein: 7.2 g/dL (ref 6.5–8.1)

## 2022-08-28 LAB — SARS CORONAVIRUS 2 BY RT PCR: SARS Coronavirus 2 by RT PCR: NEGATIVE

## 2022-08-28 LAB — CBC WITH DIFFERENTIAL/PLATELET
Abs Immature Granulocytes: 0.02 10*3/uL (ref 0.00–0.07)
Basophils Absolute: 0 10*3/uL (ref 0.0–0.1)
Basophils Relative: 1 %
Eosinophils Absolute: 0 10*3/uL (ref 0.0–0.5)
Eosinophils Relative: 0 %
HCT: 40.6 % (ref 39.0–52.0)
Hemoglobin: 13 g/dL (ref 13.0–17.0)
Immature Granulocytes: 0 %
Lymphocytes Relative: 30 %
Lymphs Abs: 2 10*3/uL (ref 0.7–4.0)
MCH: 26.4 pg (ref 26.0–34.0)
MCHC: 32 g/dL (ref 30.0–36.0)
MCV: 82.4 fL (ref 80.0–100.0)
Monocytes Absolute: 0.7 10*3/uL (ref 0.1–1.0)
Monocytes Relative: 10 %
Neutro Abs: 3.8 10*3/uL (ref 1.7–7.7)
Neutrophils Relative %: 59 %
Platelets: 293 10*3/uL (ref 150–400)
RBC: 4.93 MIL/uL (ref 4.22–5.81)
RDW: 14.5 % (ref 11.5–15.5)
WBC: 6.5 10*3/uL (ref 4.0–10.5)
nRBC: 0 % (ref 0.0–0.2)

## 2022-08-28 LAB — CBG MONITORING, ED: Glucose-Capillary: 102 mg/dL — ABNORMAL HIGH (ref 70–99)

## 2022-08-28 LAB — BLOOD GAS, VENOUS
Acid-Base Excess: 3.8 mmol/L — ABNORMAL HIGH (ref 0.0–2.0)
Bicarbonate: 30.5 mmol/L — ABNORMAL HIGH (ref 20.0–28.0)
Drawn by: 64289
O2 Saturation: 53.5 %
Patient temperature: 37
pCO2, Ven: 54 mmHg (ref 44–60)
pH, Ven: 7.36 (ref 7.25–7.43)
pO2, Ven: 34 mmHg (ref 32–45)

## 2022-08-28 LAB — ETHANOL: Alcohol, Ethyl (B): <10 mg/dL (ref <10)

## 2022-08-28 MED ORDER — POTASSIUM CHLORIDE CRYS ER 20 MEQ PO TBCR
40.0000 meq | EXTENDED_RELEASE_TABLET | Freq: Once | ORAL | Status: AC
  Administered 2022-08-28: 40 meq via ORAL
  Filled 2022-08-28: qty 2

## 2022-08-28 MED ORDER — HYDROCHLOROTHIAZIDE 12.5 MG PO TABS
12.5000 mg | ORAL_TABLET | Freq: Every day | ORAL | Status: DC
  Administered 2022-08-29 – 2022-08-30 (×2): 12.5 mg via ORAL
  Filled 2022-08-28 (×2): qty 1

## 2022-08-28 MED ORDER — ATENOLOL 50 MG PO TABS
50.0000 mg | ORAL_TABLET | Freq: Every day | ORAL | Status: DC
  Administered 2022-08-29 – 2022-08-30 (×2): 50 mg via ORAL
  Filled 2022-08-28 (×2): qty 1

## 2022-08-28 MED ORDER — DONEPEZIL HCL 5 MG PO TABS
5.0000 mg | ORAL_TABLET | Freq: Every morning | ORAL | Status: DC
  Administered 2022-08-29 – 2022-08-30 (×2): 5 mg via ORAL
  Filled 2022-08-28 (×2): qty 1

## 2022-08-28 MED ORDER — METFORMIN HCL 500 MG PO TABS
500.0000 mg | ORAL_TABLET | Freq: Every day | ORAL | Status: DC
  Administered 2022-08-29 – 2022-08-30 (×2): 500 mg via ORAL
  Filled 2022-08-28 (×2): qty 1
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
## 2022-08-28 NOTE — ED Provider Notes (Signed)
I provided a substantive portion of the care of this patient.  I personally made/approved the management plan for this patient and take responsibility for the patient management.  EKG Interpretation Date/Time:  Thursday August 28 2022 19:01:05 EDT Ventricular Rate:  60 PR Interval:  187 QRS Duration:  89 QT Interval:  394 QTC Calculation: 394 R Axis:   24  Text Interpretation: Sinus rhythm Confirmed by Lorre Nick ([id number]) on 08/28/2022 7:11:29 PM   Patient is EKG interpretation shows normal sinus rhythm.  Patient here with altered mental status.  Review of patient's old chart shows that he is being worked up by neurology for dementia.  Patient is more alert and oriented at this time.  Will contact daughter in [place] to speak about need for placement in a facility.  Will inform her that she will need to come see patient   Lorre Nick, MD 08/28/22 2144
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
## 2022-08-29 DIAGNOSIS — R4182 Altered mental status, unspecified: Secondary | ICD-10-CM | POA: Diagnosis not present

## 2022-08-29 MED ORDER — HALOPERIDOL LACTATE 5 MG/ML IJ SOLN: 5.0000 mg | Freq: Once | INTRAMUSCULAR | Status: DC

## 2022-08-29 MED ORDER — ZIPRASIDONE MESYLATE 20 MG IM SOLR: 20.0000 mg | Freq: Once | INTRAMUSCULAR | Status: DC

## 2022-08-29 NOTE — ED Notes (Signed)
Patient refused breakfast tray 

## 2022-08-29 NOTE — ED Notes (Signed)
Patient ate lunch tray
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
## 2022-08-29 NOTE — ED Notes (Signed)
I asked pt could I do his vitals and he asked why did I need to do them. I informed him that it is part of his care plan and he said no he didn't need no care plan. I then asked pt about 5 minutes later did he want a gown to sleep in where he would be more comfortable and he gave a disgusted look and asked why was I asking him so many questions. He pointed at his hospital stickers and said that those were all he need. Pt also knocked on the glass on the door 3x and so I asked him did he need anything. Pt again looked disgusted and asked me why I was asking him so many questions. Ten minutes later, pt went into the hallway and did not want to come back in room at first. A nurse that was out there was able to get him to go back into the room.

## 2022-08-30 DIAGNOSIS — R4182 Altered mental status, unspecified: Secondary | ICD-10-CM | POA: Diagnosis not present

## 2022-08-30 NOTE — ED Provider Notes (Signed)
Emergency Medicine Observation Re-evaluation Note  Jerry Caldwell. is a 77 y.o. male, seen on rounds today.  Pt initially presented to the ED for complaints of Altered Mental Status Currently, the patient is resting comfortably  Physical Exam  BP 135/86 (BP Location: Left Arm)   Pulse (!) 58   Temp 97.6 F (36.4 C) (Oral)   Resp 18   SpO2 99%  Physical Exam Vitals and nursing note reviewed.  Constitutional:      General: He is not in acute distress.    Appearance: He is well-developed.  HENT:     Head: Normocephalic and atraumatic.  Eyes:     Conjunctiva/sclera: Conjunctivae normal.  Cardiovascular:     Rate and Rhythm: Normal rate and regular rhythm.     Heart sounds: No murmur heard. Pulmonary:     Effort: Pulmonary effort is normal. No respiratory distress.  Musculoskeletal:        General: No swelling.     Cervical back: Neck supple.  Skin:    General: Skin is warm and dry.  Neurological:     Mental Status: He is alert.  Psychiatric:        Mood and Affect: Mood normal.     ED Course / MDM  EKG:EKG Interpretation Date/Time:  Thursday August 28 2022 19:01:05 EDT Ventricular Rate:  60 PR Interval:  187 QRS Duration:  89 QT Interval:  394 QTC Calculation: 394 R Axis:   24  Text Interpretation: Sinus rhythm Confirmed by Lorre Nick ([id number]) on 08/28/2022 7:11:29 PM  I have reviewed the labs performed to date as well as medications administered while in observation.  Recent changes in the last 24 hours include received Haldol for agitation overnight.  Plan for discharge in care of sister who is driving down from [place].  Plan  Current plan is for discharge with family when they arrive.    Glendora Score, MD 08/30/22 [phone]

## 2022-09-01 ENCOUNTER — Telehealth: Payer: Self-pay | Admitting: Neurology

## 2022-09-01 MED ORDER — DONEPEZIL HCL 5 MG PO TABS: 5.0000 mg | ORAL_TABLET | Freq: Every morning | ORAL | 2 refills | Status: DC

## 2022-09-01 NOTE — Telephone Encounter (Signed)
Pt's daughter reports that pt misplaced his  donepezil (ARICEPT) 5 MG tablet,  pt is with her now in [place] since he was recently released from hospital.  Daughter is asking if it is possible that another prescription of the  donepezil (ARICEPT) 5 MG tablet, can be called into a [facility] in [place] where she lives.  If so she is asking that it be called into [facility] [phone]

## 2022-10-09 ENCOUNTER — Encounter: Payer: Self-pay | Admitting: Adult Health

## 2022-10-09 ENCOUNTER — Ambulatory Visit (INDEPENDENT_AMBULATORY_CARE_PROVIDER_SITE_OTHER): Payer: Medicare Other | Admitting: Adult Health

## 2022-10-09 VITALS — BP 144/89 | HR 82 | Ht 73.0 in | Wt 169.0 lb

## 2022-10-09 DIAGNOSIS — F0280 Dementia in other diseases classified elsewhere without behavioral disturbance: Secondary | ICD-10-CM | POA: Diagnosis not present

## 2022-10-09 DIAGNOSIS — G309 Alzheimer's disease, unspecified: Secondary | ICD-10-CM | POA: Diagnosis not present

## 2022-10-09 MED ORDER — DONEPEZIL HCL 5 MG PO TABS: 5.0000 mg | ORAL_TABLET | Freq: Every morning | ORAL | 2 refills | Status: DC

## 2022-10-09 NOTE — Patient Instructions (Signed)
Your Plan:  Restart Aricept 5 mg at bedtime  Referral placed for home health If your symptoms worsen or you develop new symptoms please let us know.    Thank you for coming to see [place] at [facility]. I hope we have been able to provide you high quality care today.  You may receive a patient satisfaction survey over the next few weeks. We would appreciate your feedback and comments so that we may continue to improve ourselves and the health of our patients.

## 2022-10-09 NOTE — Progress Notes (Addendum)
PATIENT: Jerry Caldwell. DOB: 15-Apr-1945  REASON FOR VISIT: follow up HISTORY FROM: patient PRIMARY NEUROLOGIST: Dr. Vickey Huger   HISTORY OF PRESENT ILLNESS: Today 10/09/22  Jerry Caldwell. is a 77 y.o. male who has been followed in this office for memory distubance and OSA on CPAP. Returns today for follow-up. Here today with his nephew and daughter. They both live out of state. Patient feels that his memory is stable. Able to complete all ADLs independently. Reports that he doesn't dirve. No longer cooks, just heats up meals. Family reports that he does the household chores, does his own laundry. Questionable whether he is taking his medication appropriately. Has a pill box that he fills. Not on Aricept he states that he was not aware that he needed to take that. However daughter states that she picked it up for him. Not sure why he is not taking it. Has not used the CPAP since 2023.   HISTORY (Dohmeier) Jerry Caldwell. is a 77 y.o. male patient who is here for evaluation of memory deficits on  05/22/2022 ,  has on 05-06-22 an ED visit : Jerry Caldwell. is a 77 y.o. male.   Patient is a 77 year old male with a past medical history of diabetes and hypertension presenting to the emergency department with hallucinations.  The patient states for the last 2 weeks he has been seeing a human like figure that mostly shows up behind him in his car.  He states that it looks like there is a towel over his head and covering his body and he is only able to see your nose and mouth.  He states that this finger does not speak to him and does not tell him to hurt himself or others and states that he has not had any SI or HI.  He denies any headaches or other symptoms.  He denies any prior history of hallucinations. The history is provided by the patient.    Chief concern according to patient's daughter : " Visual hallucinations, over the last few months. Seeing a lady and kids in his house, they have  been there for months, hiding somewhere in the house- nobody else has met them-  no vocal communication- no auditory hallucination.  He is working as a Chief Strategy Officer. "    He has called the police several times and they never can find them. His daughter neither.    He  has previously been seen here for sleep.  He has a cpap set up feb 2016. States he does still use it but doesn't help with sleep.  Had a new sleep study in 2023, followed up with Np Jaxtin Raimondo oday 11/07/21:  REVIEW OF SYSTEMS: Out of a complete 14 system review of symptoms, the patient complains only of the following symptoms, and all other reviewed systems are negative.  ALLERGIES: Allergies  Allergen Reactions   Ace Inhibitors Cough   Lisinopril Cough   Sildenafil Other (See Comments)    Dizziness, Visual disturbance, Glaucoma    HOME MEDICATIONS: Outpatient Medications Prior to Visit  Medication Sig Dispense Refill   atenolol (TENORMIN) 50 MG tablet Take 50 mg by mouth daily.     atorvastatin (LIPITOR) 80 MG tablet Take 80 mg by mouth daily.     COZAAR 50 MG tablet Take 50 mg by mouth daily.     donepezil (ARICEPT) 5 MG tablet Take 1 tablet (5 mg total) by mouth every morning. 30 tablet 2   hydrochlorothiazide (  HYDRODIURIL) 25 MG tablet Take 12.5 mg by mouth daily.     metFORMIN (GLUCOPHAGE) 500 MG tablet Take 500 mg by mouth daily with breakfast.     timolol (TIMOPTIC) 0.5 % ophthalmic solution Place 1 drop into both eyes 2 (two) times daily.     No facility-administered medications prior to visit.    PAST MEDICAL HISTORY: Past Medical History:  Diagnosis Date   Allergy    Arthritis    right knee    Diabetes (HCC)    Diverticulosis    Glaucoma    HTN (hypertension)    Hyperlipidemia    Hypogonadism in male    Migraines    Sleep apnea    uses cpap     PAST SURGICAL HISTORY: Past Surgical History:  Procedure Laterality Date   ANTERIOR CRUCIATE LIGAMENT REPAIR  1992   COLONOSCOPY   03/31/2006   Diverticulosis   ROTATOR CUFF REPAIR Right 2008    FAMILY HISTORY: Family History  Problem Relation Age of Onset   Glaucoma Mother    Cancer - Lung Mother    Diabetes Sister    Diabetes type II Maternal Aunt    Colon cancer Neg Hx    Colon polyps Neg Hx    Esophageal cancer Neg Hx    Rectal cancer Neg Hx    Stomach cancer Neg Hx    Sleep apnea Neg Hx     SOCIAL HISTORY: Social History   Socioeconomic History   Marital status: Widowed    Spouse name: Bonita Quin   Number of children: 4   Years of education: College   Highest education level: Not on file  Occupational History   Not on file  Tobacco Use   Smoking status: Former   Smokeless tobacco: Never  Substance and Sexual Activity   Alcohol use: No   Drug use: No   Sexual activity: Not on file  Other Topics Concern   Not on file  Social History Narrative   Patient is married Bonita Quin) Patient has four children.Patient has a college degree.Patient is a Doctor, 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      Update 11/07/2021   Patient lost his wife 3 years ago    Social Determinants of Corporate investment banker Strain: Not on file  Food Insecurity: Not on file  Transportation Needs: Not on file  Physical Activity: Not on file  Stress: Not on file  Social Connections: Not on file  Intimate Partner Violence: Not on file      PHYSICAL EXAM  Vitals:   10/09/22 1130  BP: (!) 144/89  Pulse: 82  Weight: 169 lb (76.7 kg)  Height: 6\' 1"  (1.854 m)   Body mass index is 22.3 kg/m.     05/22/2022   11:30 AM 05/06/2021    9:47 AM  MMSE - Mini Mental State Exam  Orientation to time 4 5  Orientation to Place 4 4  Registration 3 3  Attention/ Calculation 2 3  Recall 0 0  Language- name 2 objects 2 2  Language- repeat 1 1  Language- follow 3 step command 3 3  Language- read &  follow direction 1 1  Write a sentence 1 1  Copy design 1 0  Total score 22 23     Generalized: Well developed, in no acute distress   Neurological examination  Mentation: Alert. Follows all commands  speech and language fluent Cranial nerve II-XII: Pupils were equal round reactive to light. Extraocular movements were full, visual field were full on confrontational test. Facial sensation and strength were normal. Uvula tongue midline. Head turning and shoulder shrug  were normal and symmetric. Motor: The motor testing reveals 5 over 5 strength of all 4 extremities. Good symmetric motor tone is noted throughout.  Sensory: Sensory testing is intact to soft touch on all 4 extremities. No evidence of extinction is noted.  Coordination: Cerebellar testing reveals good finger-nose-finger and heel-to-shin bilaterally.  Gait and station: Gait is normal. Tandem gait is normal. Romberg is negative. No drift is seen.  Reflexes: Deep tendon reflexes are symmetric and normal bilaterally.   DIAGNOSTIC DATA (LABS, IMAGING, TESTING) - I reviewed patient records, labs, notes, testing and imaging myself where available.  Lab Results  Component Value Date   WBC 6.5 08/28/2022   HGB 13.0 08/28/2022   HCT 40.6 08/28/2022   MCV 82.4 08/28/2022   PLT 293 08/28/2022      Component Value Date/Time   NA 141 08/28/2022 1940   NA 144 05/22/2022 1304   K 3.3 (L) 08/28/2022 1940   CL 106 08/28/2022 1940   CO2 26 08/28/2022 1940   GLUCOSE 90 08/28/2022 1940   BUN 12 08/28/2022 1940   BUN 8 05/22/2022 1304   CREATININE 1.23 08/28/2022 1940   CALCIUM 9.6 08/28/2022 1940   PROT 7.2 08/28/2022 1940   PROT 7.2 05/22/2022 1304   ALBUMIN 3.7 08/28/2022 1940   ALBUMIN 4.4 05/22/2022 1304   AST 16 08/28/2022 1940   ALT 14 08/28/2022 1940   ALKPHOS 76 08/28/2022 1940   BILITOT 0.8 08/28/2022 1940   BILITOT 0.5 05/22/2022 1304   GFRNONAA >60 08/28/2022 1940   GFRAA >60 02/27/2019 0400   Lab Results   Component Value Date   TRIG 203 (H) 02/21/2019   Lab Results  Component Value Date   HGBA1C 7.1 (H) 05/22/2022   Lab Results  Component Value Date   VITAMINB12 748 05/22/2022   Lab Results  Component Value Date   TSH 0.280 (L) 05/22/2022      ASSESSMENT AND PLAN 77 y.o. year old male  has a past medical history of Allergy, Arthritis, Diabetes (HCC), Diverticulosis, Glaucoma, HTN (hypertension), Hyperlipidemia, Hypogonadism in male, Migraines, and Sleep apnea. here with:  Memory disturbance- Alzheimer disease  - PET scan was consistent with Alzheimer's disease - Had a long discussion with family- I do feel that he needs help with his medication, in the future may require more assistance/ supervision. I gave them resources to look over.  - Restart Aricept 5 mg at bedtime. Reviewed side effects and provided them info on their AVS - Referral placed for Home health  - FU in 6 months are sooner if needed.    Butch Penny, MSN, NP-C 10/09/2022, 11:32 AM [facility] [address] [place], [place] [address] [phone]

## 2022-10-10 ENCOUNTER — Telehealth: Payer: Self-pay | Admitting: Adult Health

## 2022-10-10 DIAGNOSIS — F0280 Dementia in other diseases classified elsewhere without behavioral disturbance: Secondary | ICD-10-CM

## 2022-10-10 NOTE — Telephone Encounter (Signed)
[facility] is taking this patient.

## 2022-10-22 NOTE — Addendum Note (Signed)
Addended by: Bertram Savin on: 10/22/2022 05:14 PM   Modules accepted: Orders

## 2022-10-22 NOTE — Telephone Encounter (Signed)
Pt's daughter has called to report that when the Home health worker went to pt's home pt told them he was not in need of their services.  Pt's daughter states pt does need the service but now a new order needs to be sent to [facility] health ph#[phone] fax#[phone], daughter will take off to ensure she is with pt for home health visits

## 2022-10-22 NOTE — Telephone Encounter (Signed)
Order for referral to home health placed again as previously placed by MM NP.

## 2022-10-23 NOTE — Telephone Encounter (Signed)
I called the pt's daughter Arlina Robes (on [place]) and advised referral has been sent to [facility] again. She was appreciative.

## 2022-10-23 NOTE — Telephone Encounter (Signed)
Referral sent to [facility].

## 2022-12-31 ENCOUNTER — Telehealth: Payer: Self-pay | Admitting: Adult Health

## 2022-12-31 NOTE — Telephone Encounter (Signed)
Daughter called to confirm information on DPR

## 2023-01-01 NOTE — Addendum Note (Signed)
Addended by: Bertram Savin on: 01/01/2023 05:18 PM   Modules accepted: Orders

## 2023-01-01 NOTE — Telephone Encounter (Signed)
Noted. I placed the referral again and notified Calvin.

## 2023-01-01 NOTE — Telephone Encounter (Signed)
Pt's daughter, Arlina Robes stated called [facility]. [facility] Jerilynn Som) stated, referral that was October 2024, only lasted for 30 days and a new referral would need to be sent.

## 2023-01-26 ENCOUNTER — Telehealth: Payer: Self-pay | Admitting: Adult Health

## 2023-01-26 NOTE — Telephone Encounter (Signed)
 [facility] called asking if pt can be seen by Megan before 02/12/23. If he's not seen before then, insurance will no longer pay for [facility]. There are currently no openings, not sure if he can be added on somewhere? They're stating appt would have to be with Megan since she ordered the [facility].

## 2023-01-26 NOTE — Telephone Encounter (Signed)
 I have cancellations regularly- keep a look for those.

## 2023-01-27 NOTE — Telephone Encounter (Signed)
 LVM asking pt to call back to schedule appointment with [facility]  If pt calls back I have a held slot for him with Megan for 02/02/23 at 10:30am

## 2023-01-27 NOTE — Telephone Encounter (Signed)
 Spoke with pt's daughter, pt scheduled with Megan for 02/02/23 at 10:30am

## 2023-01-28 ENCOUNTER — Telehealth: Payer: Self-pay | Admitting: Adult Health

## 2023-01-28 NOTE — Telephone Encounter (Signed)
 Jerry Caldwell with [facility] requesting approval of order for speech therapy: 1 time a week for 4 weeks for cognitive impairment . Requesting call back [phone]

## 2023-01-29 NOTE — Telephone Encounter (Signed)
 Returned call to [facility] and gave v.o. per MM NP for ST eval and visits 1/week x 4 weeks. She states pt is not safe to live at home alone and she is talking with the daughter about next steps for the patient. Suggestion may be assisted living and moving to [place] to be near daughter. The patient is aware that he will need to move although may not be aware that he is unsafe alone.

## 2023-01-29 NOTE — Telephone Encounter (Signed)
 That's fine

## 2023-02-01 NOTE — Progress Notes (Signed)
 PATIENT: Jerry Caldwell. DOB: June 29, 1945  REASON FOR VISIT: follow up HISTORY FROM: patient PRIMARY NEUROLOGIST: Dr. Chalice   Chief Complaint  Patient presents with   Follow-up    Rm 19, daughter, Nat.   Stable per daughter.      HISTORY OF PRESENT ILLNESS: Today 02/01/23  Jerry Caldwell. is a 78 y.o. male with a history of Alzheimer's disease. Returns today for follow-up.  He is here today with his daughter.  He continues to live at home alone.  Home health has been coming out to assist the patient.  Daughter does feel that this has been beneficial.  He is able to complete all ADLs independently.  He does need help with his medications.  His daughter has tried to assist him but she is unable to go there every day.  Overall daughter feels that his memory is stable.  Occasionally she reports that he will have hallucinations.  He will see people in his home.  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  Currently hallucinations are not causing the patient any great distress.  At the last visit we restarted Aricept  however the daughter is not sure how often he is taking this.  He returns today for an evaluation.   10/09/22: Jerry Caldwell. is a 78 y.o. male who has been followed in this office for memory distubance and OSA on CPAP. Returns today for follow-up. Here today with his nephew and daughter. They both live out of state. Patient feels that his memory is stable. Able to complete all ADLs independently. Reports that he doesn't dirve. No longer cooks, just heats up meals. Family reports that he does the household chores, does his own laundry. Questionable whether he is taking his medication appropriately. Has a pill box that he fills. Not on Aricept  he states that he was not aware that he needed to take that. However daughter states that she picked it up for him. Not sure why he is not taking it. Has not used the CPAP since 2023.   HISTORY  (Dohmeier) Jerry Caldwell. is a 78 y.o. male patient who is here for evaluation of memory deficits on  05/22/2022 ,  has on 05-06-22 an ED visit : Jerry Caldwell. is a 78 y.o. male.   Patient is a 78 year old male with a past medical history of diabetes and hypertension presenting to the emergency department with hallucinations.  The patient states for the last 2 weeks he has been seeing a human like figure that mostly shows up behind him in his car.  He states that it looks like there is a towel over his head and covering his body and he is only able to see your nose and mouth.  He states that this finger does not speak to him and does not tell him to hurt himself or others and states that he has not had any SI or HI.  He denies any headaches or other symptoms.  He denies any prior history of hallucinations. The history is provided by the patient.    Chief concern according to patient's daughter :  Visual hallucinations, over the last few months. Seeing a lady and kids in his house, they have been there for months, hiding somewhere in the house- nobody else has met them-  no vocal communication- no auditory hallucination.  He is working as a chief strategy officer.     He has called the police  several times and they never can find them. His daughter neither.    He  has previously been seen here for sleep.  He has a cpap set up feb 2016. States he does still use it but doesn't help with sleep.  Had a new sleep study in 2023, followed up with Np Angelino Rumery oday 11/07/21:  REVIEW OF SYSTEMS: Out of a complete 14 system review of symptoms, the patient complains only of the following symptoms, and all other reviewed systems are negative.  ALLERGIES: Allergies  Allergen Reactions   Ace Inhibitors Cough   Lisinopril Cough   Sildenafil Other (See Comments)    Dizziness, Visual disturbance, Glaucoma    HOME MEDICATIONS: Outpatient Medications Prior to Visit  Medication Sig Dispense Refill    atenolol  (TENORMIN ) 50 MG tablet Take 50 mg by mouth daily.     atorvastatin  (LIPITOR) 80 MG tablet Take 80 mg by mouth daily.     COZAAR  50 MG tablet Take 50 mg by mouth daily.     donepezil  (ARICEPT ) 5 MG tablet Take 1 tablet (5 mg total) by mouth every morning. 30 tablet 2   hydrochlorothiazide  (HYDRODIURIL ) 25 MG tablet Take 12.5 mg by mouth daily.     metFORMIN  (GLUCOPHAGE ) 500 MG tablet Take 500 mg by mouth daily with breakfast.     timolol  (TIMOPTIC ) 0.5 % ophthalmic solution Place 1 drop into both eyes 2 (two) times daily.     No facility-administered medications prior to visit.    PAST MEDICAL HISTORY: Past Medical History:  Diagnosis Date   Allergy    Arthritis    right knee    Diabetes (HCC)    Diverticulosis    Glaucoma    HTN (hypertension)    Hyperlipidemia    Hypogonadism in male    Migraines    Sleep apnea    uses cpap     PAST SURGICAL HISTORY: Past Surgical History:  Procedure Laterality Date   ANTERIOR CRUCIATE LIGAMENT REPAIR  1992   COLONOSCOPY  03/31/2006   Diverticulosis   ROTATOR CUFF REPAIR Right 2008    FAMILY HISTORY: Family History  Problem Relation Age of Onset   Glaucoma Mother    Cancer - Lung Mother    Diabetes Sister    Diabetes type II Maternal Aunt    Colon cancer Neg Hx    Colon polyps Neg Hx    Esophageal cancer Neg Hx    Rectal cancer Neg Hx    Stomach cancer Neg Hx    Sleep apnea Neg Hx     SOCIAL HISTORY: Social History   Socioeconomic History   Marital status: Widowed    Spouse name: [name]   Number of children: 4   Years of education: College   Highest education level: Not on file  Occupational History   Not on file  Tobacco Use   Smoking status: Former   Smokeless tobacco: Never  Substance and Sexual Activity   Alcohol use: No   Drug use: No   Sexual activity: Not on file  Other Topics Concern   Not on file  Social History Narrative   Patient is married Joya) Patient has four children.Patient has a  college degree.Patient is a doctor, 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      Update 11/07/2021   Patient lost his wife 3 years ago    Social Drivers of Health  Financial Resource Strain: Not on file  Food Insecurity: Not on file  Transportation Needs: Not on file  Physical Activity: Not on file  Stress: Not on file  Social Connections: Not on file  Intimate Partner Violence: Not on file      PHYSICAL EXAM  Vitals:   02/02/23 1036 02/02/23 1052  BP: (!) 152/86 135/81  Pulse: (!) 56 (!) 57  Weight: 163 lb 9.6 oz (74.2 kg)   Height: 6' 1 (1.854 m)     Body mass index is 21.58 kg/m.     02/02/2023   10:48 AM 05/22/2022   11:30 AM 05/06/2021    9:47 AM  MMSE - Mini Mental State Exam  Orientation to time 2 4 5   Orientation to Place 3 4 4   Registration 2 3 3   Attention/ Calculation 1 2 3   Recall 0 0 0  Language- name 2 objects 2 2 2   Language- repeat 0 1 1  Language- follow 3 step command 3 3 3   Language- read & follow direction 1 1 1   Write a sentence 1 1 1   Copy design 0 1 0  Total score 15 22 23      Generalized: Well developed, in no acute distress   Neurological examination  Mentation: Alert. Follows all commands speech and language fluent Cranial nerve II-XII: Pupils were equal round reactive to light. Extraocular movements were full, visual field were full on confrontational test. Facial sensation and strength were normal. Uvula tongue midline. Head turning and shoulder shrug  were normal and symmetric. Motor: The motor testing reveals 5 over 5 strength of all 4 extremities. Good symmetric motor tone is noted throughout.  Sensory: Sensory testing is intact to soft touch on all 4 extremities. No evidence of extinction is noted.  Coordination: Cerebellar testing reveals good finger-nose-finger and heel-to-shin bilaterally.  Gait and  station: Gait is normal.     DIAGNOSTIC DATA (LABS, IMAGING, TESTING) - I reviewed patient records, labs, notes, testing and imaging myself where available.  Lab Results  Component Value Date   WBC 6.5 08/28/2022   HGB 13.0 08/28/2022   HCT 40.6 08/28/2022   MCV 82.4 08/28/2022   PLT 293 08/28/2022      Component Value Date/Time   NA 141 08/28/2022 1940   NA 144 05/22/2022 1304   K 3.3 (L) 08/28/2022 1940   CL 106 08/28/2022 1940   CO2 26 08/28/2022 1940   GLUCOSE 90 08/28/2022 1940   BUN 12 08/28/2022 1940   BUN 8 05/22/2022 1304   CREATININE 1.23 08/28/2022 1940   CALCIUM  9.6 08/28/2022 1940   PROT 7.2 08/28/2022 1940   PROT 7.2 05/22/2022 1304   ALBUMIN 3.7 08/28/2022 1940   ALBUMIN 4.4 05/22/2022 1304   AST 16 08/28/2022 1940   ALT 14 08/28/2022 1940   ALKPHOS 76 08/28/2022 1940   BILITOT 0.8 08/28/2022 1940   BILITOT 0.5 05/22/2022 1304   GFRNONAA >60 08/28/2022 1940   GFRAA >60 02/27/2019 0400   Lab Results  Component Value Date   TRIG 203 (H) 02/21/2019   Lab Results  Component Value Date   HGBA1C 7.1 (H) 05/22/2022   Lab Results  Component Value Date   VITAMINB12 748 05/22/2022   Lab Results  Component Value Date   TSH 0.280 (L) 05/22/2022      ASSESSMENT AND PLAN 78 y.o. year old male  has a past medical history of Allergy, Arthritis, Diabetes (HCC), Diverticulosis, Glaucoma, HTN (hypertension), Hyperlipidemia, Hypogonadism in male, Migraines,  and Sleep apnea. here with:  Memory disturbance- Alzheimer disease   -I do feel that continuing home health will be beneficial. -I did have a discussion with his daughter.  I do feel that they need to be planning for long-term assistance.  I did give her a resource packet today -Encouraged the daughter to check his medication to see if he is taking Aricept . -Follow-up in 4 to 5 months or sooner if needed    Duwaine Russell, MSN, NP-C 02/01/2023, 8:24 AM [facility] [address]  [address] [place], [place] [address] [phone]

## 2023-02-02 ENCOUNTER — Encounter: Payer: Self-pay | Admitting: Adult Health

## 2023-02-02 ENCOUNTER — Telehealth: Payer: Self-pay | Admitting: Adult Health

## 2023-02-02 ENCOUNTER — Ambulatory Visit (INDEPENDENT_AMBULATORY_CARE_PROVIDER_SITE_OTHER): Payer: Medicare Other | Admitting: Adult Health

## 2023-02-02 VITALS — BP 135/81 | HR 57 | Ht 73.0 in | Wt 163.6 lb

## 2023-02-02 DIAGNOSIS — F0280 Dementia in other diseases classified elsewhere without behavioral disturbance: Secondary | ICD-10-CM

## 2023-02-02 DIAGNOSIS — G309 Alzheimer's disease, unspecified: Secondary | ICD-10-CM

## 2023-02-02 NOTE — Telephone Encounter (Signed)
 Pt's daughter called at 10:15 am to verify appointment for today. P.t's daughter said we are on our way. Informed her if arrive after 10:30 am may have to reschedule appointment. She verbalized understand

## 2023-02-02 NOTE — Patient Instructions (Signed)
 Will continue with home health. Please review resource packets today as long-term assistance is good to be needed. Reviewed patient's medications to see if he is taking them appropriately.  Perhaps home health can assist with this

## 2023-02-03 ENCOUNTER — Telehealth: Payer: Self-pay | Admitting: Adult Health

## 2023-02-03 NOTE — Telephone Encounter (Signed)
 Called Grace back and LVM giving v.o. for OT visits as requested below.

## 2023-02-03 NOTE — Telephone Encounter (Signed)
 noted

## 2023-02-03 NOTE — Telephone Encounter (Signed)
 [facility]/ Jerry Caldwell request verbal  orders for Occupational  T Frequency:  1x wk for 1 wk 2x a for 2 wks 1x wk for 5 wks  If I do not answer can leave vm, phone number is secure.

## 2023-02-11 ENCOUNTER — Telehealth: Payer: Self-pay | Admitting: *Deleted

## 2023-02-11 NOTE — Telephone Encounter (Signed)
Christy from [facility] calling to report his BP outside of parameters inital reading 11:35 180/92 2 mins ago 160/78 .  I took call.  Pt lives alone, daughter on her way from [place],  to take to PCP for appt to have FL-2 filled out for him to be placed in facility.  She stated they have been going out and seeing pt.  She did not she medication bottles today.  Unsure if taken his medications.   Not a safe situation.  She noted having hallucinations as well.  This is not uncommon for him (as per daughter mentioned in last OV when saw pt).  [facility] option for pt. I relayed that pcp can address Bp if needed when in for appt.  I stated glad he had this appt..  will call back as needed.

## 2023-02-17 ENCOUNTER — Telehealth: Payer: Self-pay | Admitting: Adult Health

## 2023-02-17 NOTE — Telephone Encounter (Signed)
OT Jerry Caldwell with Jerry Caldwell is with pt, his blood pressure (which has been checked 3 times) is 180/100 she is unsure if pt is taking his blood pressure medication properly, she is asking to be called back.  She has asked the message be sent as high priority.

## 2023-02-17 NOTE — Telephone Encounter (Signed)
I called Grace back and LVM. Pt saw PCP recently and BP there was 132/82. They did not recommend any medication changes at that time. We received a call from [facility] last week for high BP and this was prior to his PCP visit.

## 2023-02-18 NOTE — Telephone Encounter (Signed)
Received a call from pt's speech therapist, Okey Regal w/ [facility], who reports that she is seeing the patient for a session and noticed that patient is out of meds and possibly has been out for more than 1 day. There just happened to be 4 medications in the mail today (Finasteride, Metformin, hydrochlorothiazide, and Atorvastatin) and the patient was given all but one nighttime med (Atorvastatin). She put them in his pill box. He has two boxes and they are empty except one has disintegrated pills in there. She states he is unsafe to live at home alone. She has been in touch with the daughter (she lives 6 hours away in [place]) who thankfully has been able to find placement for the patient in memory care and we are about a week from placement. The patient is saying that he has been lightheaded for a few days. He is also disoriented and he is having trouble following simple commands. Okey Regal is trying to take his BP again. A nurse tried to visit the pt this AM but he was not home. Okey Regal says he has been driving his truck or walking on foot. He has gotten lost before and the police had to return him home. She is getting ready to call the nurse to come back or even their manager to visit the patient now. I told her I am recommending the ER as it is difficult to know why he is more disoriented and also lightheaded. Could it be BP or even UTI, etc? She is going to start with having another staff member come there. She will keep [place] informed and call the daughter back to see if she can get him placed any faster.

## 2023-02-18 NOTE — Telephone Encounter (Signed)
Agree

## 2023-02-23 ENCOUNTER — Telehealth: Payer: Self-pay | Admitting: *Deleted

## 2023-02-23 NOTE — Telephone Encounter (Signed)
Received 2 faxes from [facility] [place], [place] [phone], fax [phone].(ok to take orders from pcp Dr. Guerry Bruin at [facility].  Discharge [facility] aide services due to non compliance with POC.  Another fax 02/18/2023  vital sign alert report. 160/100.  (Pt had not taken med in several days).

## 2023-03-12 ENCOUNTER — Telehealth: Payer: Self-pay | Admitting: *Deleted

## 2023-03-12 ENCOUNTER — Ambulatory Visit: Payer: Medicare Other | Admitting: Neurology

## 2023-03-12 NOTE — Telephone Encounter (Signed)
 Received fax from [facility] stating pt has been discharged/transferred to SNF. Fax placed on MM NP's desk for review.

## 2023-03-17 ENCOUNTER — Ambulatory Visit: Payer: Medicare Other | Admitting: Neurology

## 2023-04-14 ENCOUNTER — Encounter: Payer: TRICARE For Life (TFL) | Admitting: Psychology

## 2023-05-21 ENCOUNTER — Encounter: Payer: TRICARE For Life (TFL) | Admitting: Psychology

## 2023-05-26 ENCOUNTER — Telehealth: Payer: Self-pay | Admitting: Neurology

## 2023-05-26 NOTE — Telephone Encounter (Signed)
 Appointment details confirmed with daughter

## 2023-06-02 ENCOUNTER — Ambulatory Visit: Payer: Medicare Other | Admitting: Neurology

## 2023-06-02 ENCOUNTER — Ambulatory Visit: Admitting: Neurology

## 2023-07-30 ENCOUNTER — Ambulatory Visit: Admitting: Neurology

## 2023-08-06 ENCOUNTER — Ambulatory Visit: Admitting: Neurology

## 2023-08-07 ENCOUNTER — Encounter: Payer: Self-pay | Admitting: Advanced Practice Midwife

## 2023-11-12 ENCOUNTER — Ambulatory Visit: Admitting: Neurology

## 2023-11-12 ENCOUNTER — Encounter: Payer: Self-pay | Admitting: Neurology

## 2023-11-12 VITALS — BP 124/82 | HR 61 | Wt 197.4 lb

## 2023-11-12 DIAGNOSIS — G309 Alzheimer's disease, unspecified: Secondary | ICD-10-CM | POA: Diagnosis not present

## 2023-11-12 DIAGNOSIS — E854 Organ-limited amyloidosis: Secondary | ICD-10-CM

## 2023-11-12 DIAGNOSIS — I680 Cerebral amyloid angiopathy: Secondary | ICD-10-CM | POA: Diagnosis not present

## 2023-11-12 DIAGNOSIS — F02818 Dementia in other diseases classified elsewhere, unspecified severity, with other behavioral disturbance: Secondary | ICD-10-CM | POA: Diagnosis not present

## 2023-11-12 MED ORDER — DONEPEZIL HCL 5 MG PO TABS: 5.0000 mg | ORAL_TABLET | Freq: Every morning | ORAL | 11 refills | Status: AC

## 2023-11-12 NOTE — Patient Instructions (Signed)
 78 y.o. year old gentleman, here with:     1)  major neurocognitive disorder with behavioral changes, advanced amyloid angiopathy and AD biomarker/ PET  positive for Alzheimer's disease. ,    Refill Aricept  and RV in 12 months with Np.   Aricept  10 mg increase discussed but decided to stay at 5 mg    Remoron for sleep has been working , please  Continue.          I would like to thank Tisovec, Charlie ORN, MD for allowing me to meet with this pleasant patient.

## 2023-11-12 NOTE — Progress Notes (Signed)
 Provider:  Dedra Gores, MD  Primary Care Physician:  Tisovec, Richard W, MD [address] [place] [address]     Referring Provider: Vernadine Charlie ORN, Md [address] [place],  [place] [address]          Chief Complaint according to patient   Patient presents with:                HISTORY OF PRESENT ILLNESS:  Jerry Knaak. is a 78 y.o. male patient who is here for revisit 11/12/2023 for  a problem of memory disorder, associated with hallucinations, agitation, and was seen in the ED with a decompensation in January 2025. He lived alone , had not ben eating well.   Major neurocognitive disorder ,  now living at [facility] where he does help with little tasks and fells good about living there.  Daughter feels he has been adjusting well.      11/12/2023    1:37 PM 02/02/2023   10:48 AM 05/22/2022   11:30 AM 05/06/2021    9:47 AM  MMSE - Mini Mental State Exam  Orientation to time 5 2 4 5   Orientation to Place 3 3 4 4   Registration 3 2 3 3   Attention/ Calculation 0 1 2 3   Recall 1 0 0 0  Language- name 2 objects 2 2 2 2   Language- repeat 0 0 1 1  Language- follow 3 step command 3 3 3 3   Language- read & follow direction 1 1 1 1   Write a sentence 1 1 1 1   Copy design 0 0 1 0  Total score 19 15 22 23      . IMPRESSION:   This MRI of the brain with and without contrast shows the following: Mild to moderate generalized cortical atrophy, similar to the 2023 MRI.  No definite lobar preference pattern is noted Scattered T2/FLAIR hyperintense foci in the hemispheres consistent with mild chronic microvascular ischemic change, essentially unchanged compared to the 2023 MRI. Chronic microhemorrhages in the cerebellar hemispheres and in the right parietal lobe.  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 No acute  findings.  Normal enhancement pattern.       INTERPRETING PHYSICIAN:  Richard A. Sater, MD, PhD, FAAN Certified in  Neuroimaging by [employer]  IMPRESSION PET AMYLOID 07-28-2022: : Positive scan for brain amyloid is most consistent with the presence of moderate to frequent neuritic beta-amyloid plaques in the brain.        Here for new memory concerns. Pt states that his memory issues started declining about 5 years ago. Pt states that Dementia runs in his family and has concerns about his memory. Pt's daughter states that pt has a hard time putting words and sentences together. Pt's Daughter states that pt is seeing people that aren't there.       HISTORY OF PRESENT ILLNESS:  Jerry Gartin. is a 78 y.o. male patient who is here for evaluation of memory deficits on  05/22/2022 ,  has on 05-06-22 an ED visit :    Patient is a 78 year old male with a past medical history of diabetes and hypertension presenting to the emergency department with hallucinations.  The patient states for the last 2 weeks he has been seeing a human like figure that mostly shows up behind him in  his car.  He states that it looks like there is a towel over his head and covering his body and he is only able to see your nose and mouth.  He states that this finger does not speak to him and does not tell him to hurt himself or others and states that he has not had any SI or HI.  He denies any headaches or other symptoms.  He denies any prior history of hallucinations. The history is provided by the patient.    Chief concern according to patient's daughter :  Visual hallucinations, over the last few months. Seeing a lady and kids in his house, they have been there for months, hiding somewhere in the house- nobody else has met them-  no vocal communication- no auditory hallucination.  He is working as a Chief Strategy Officer.     He has called the police several times and they never can find them.  His daughter neither.    He  has previously been seen here for sleep.  He has a cpap set up feb 2016. States he does still use it but doesn't help with sleep.  Had a new sleep study in 2023, followed up with Np [place] oday 11/07/21:   Mr. Dymek is a 78 year old male with a history of obstructive sleep apnea on CPAP.  He returns today for follow-up.  Unfortunately we were unable to get a download today.  He does state that he tries to use the machine nightly.  However he does not feel that it is working correctly.  He would like a new machine if possible.    Review of Systems: Out of a complete 14 system review, the patient complains of only the following symptoms, and all other reviewed systems are negative.:       Social History   Socioeconomic History   Marital status: Widowed    Spouse name: Jerry Caldwell   Number of children: 4   Years of education: College   Highest education level: Not on file  Occupational History   Not on file  Tobacco Use   Smoking status: Former   Smokeless tobacco: Never  Substance and Sexual Activity   Alcohol use: No   Drug use: No   Sexual activity: Not on file  Other Topics Concern   Not on file  Social History Narrative   Patient is married Jerry Caldwell) Patient has four children.Patient has a college degree.Patient is a Doctor, 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      Update 11/07/2021   Patient lost his wife 3 years ago    1 cup with breakfast 2-3  times a week, 1 cup of soda weekly    Social Drivers of Corporate investment banker Strain: Not on file  Food Insecurity: Not on file  Transportation Needs: Not on file  Physical Activity: Not on file  Stress: Not on file  Social Connections: Not on file    Family History  Problem Relation Age of Onset   Dementia Mother    Glaucoma Mother    Cancer - Lung Mother     Diabetes Sister    Dementia Maternal Aunt    Diabetes type II Maternal Aunt    Dementia Other    Colon cancer Neg Hx    Colon polyps Neg Hx    Esophageal cancer Neg Hx    Rectal cancer  Neg Hx    Stomach cancer Neg Hx    Seizures Neg Hx    Stroke Neg Hx    Sleep apnea Neg Hx     Past Medical History:  Diagnosis Date   Allergy    Arthritis    right knee    Diabetes (HCC)    Diverticulosis    Glaucoma    HTN (hypertension)    Hyperlipidemia    Hypogonadism in male    Migraines    Sleep apnea    uses cpap     Past Surgical History:  Procedure Laterality Date   ANTERIOR CRUCIATE LIGAMENT REPAIR  1992   COLONOSCOPY  03/31/2006   Diverticulosis   ROTATOR CUFF REPAIR Right 2008     Current Outpatient Medications on File Prior to Visit  Medication Sig Dispense Refill   acetaminophen  (TYLENOL ) 325 MG tablet Take 650 mg by mouth every 4 (four) hours as needed.     amLODipine (NORVASC) 5 MG tablet Take 5 mg by mouth daily. for blood pressure     atenolol  (TENORMIN ) 50 MG tablet Take 50 mg by mouth daily.     atorvastatin  (LIPITOR) 80 MG tablet Take 80 mg by mouth daily.     finasteride  (PROSCAR ) 5 MG tablet Take 5 mg by mouth daily.     hydrochlorothiazide  (HYDRODIURIL ) 25 MG tablet Take 12.5 mg by mouth daily.     latanoprost  (XALATAN ) 0.005 % ophthalmic solution Ophthalmic     loratadine (CLARITIN) 10 MG tablet Take 10 mg by mouth.     MELATONIN PO Take 1 tablet by mouth at bedtime.     metFORMIN  (GLUCOPHAGE ) 500 MG tablet Take 500 mg by mouth daily with breakfast.     mirtazapine (REMERON) 15 MG tablet Take 15 mg by mouth.     sodium chloride  (OCEAN) 0.65 % nasal spray Place into the nose.     timolol  (TIMOPTIC ) 0.5 % ophthalmic solution Place 1 drop into both eyes 2 (two) times daily.     COZAAR  50 MG tablet Take 50 mg by mouth daily.     donepezil  (ARICEPT ) 5 MG tablet Take 1 tablet (5 mg total) by mouth every morning. 30 tablet 2   No current facility-administered  medications on file prior to visit.    Allergies  Allergen Reactions   Ace Inhibitors Cough   Lisinopril Cough and Other (See Comments)   Sildenafil Other (See Comments)    Dizziness, Visual disturbance, Glaucoma     DIAGNOSTIC DATA (LABS, IMAGING, TESTING) - I reviewed patient records, labs, notes, testing and imaging myself where available.  Lab Results  Component Value Date   WBC 6.5 08/28/2022   HGB 13.0 08/28/2022   HCT 40.6 08/28/2022   MCV 82.4 08/28/2022   PLT 293 08/28/2022      Component Value Date/Time   NA 141 08/28/2022 1940   NA 144 05/22/2022 1304   K 3.3 (L) 08/28/2022 1940   CL 106 08/28/2022 1940   CO2 26 08/28/2022 1940   GLUCOSE 90 08/28/2022 1940   BUN 12 08/28/2022 1940   BUN 8 05/22/2022 1304   CREATININE 1.23 08/28/2022 1940   CALCIUM  9.6 08/28/2022 1940   PROT 7.2 08/28/2022 1940   PROT 7.2 05/22/2022 1304   ALBUMIN 3.7 08/28/2022 1940   ALBUMIN 4.4 05/22/2022 1304   AST 16 08/28/2022 1940   ALT 14 08/28/2022 1940   ALKPHOS 76 08/28/2022 1940   BILITOT 0.8 08/28/2022 1940   BILITOT 0.5 05/22/2022  1304   GFRNONAA >60 08/28/2022 1940   GFRAA >60 02/27/2019 0400   Lab Results  Component Value Date   TRIG 203 (H) 02/21/2019   Lab Results  Component Value Date   HGBA1C 7.1 (H) 05/22/2022   Lab Results  Component Value Date   VITAMINB12 748 05/22/2022   Lab Results  Component Value Date   TSH 0.280 (L) 05/22/2022    PHYSICAL EXAM:  Vitals:   11/12/23 1326  BP: 124/82  Pulse: 61  SpO2: 99%   No data found. Body mass index is 26.04 kg/m.   Wt Readings from Last 3 Encounters:  11/12/23 197 lb 6.4 oz (89.5 kg)  02/02/23 163 lb 9.6 oz (74.2 kg)  10/09/22 169 lb (76.7 kg)     Ht Readings from Last 3 Encounters:  02/02/23 6' 1 (1.854 m)  10/09/22 6' 1 (1.854 m)  05/22/22 6' 1 (1.854 m)      General: The patient is awake, alert and appears not in acute distress and groomed. Head: Normocephalic, atraumatic.  Neck is  supple. Speech is not fluent with mild dysphonia and aphasia.  Mood and affect are interested,  not anxious. Conversant.    Cranial nerves: Pupils are equal and briskly reactive to light. Funduscopic exam without  evidence of pallor or edema. Extraocular movements  in vertical and horizontal planes intact and without nystagmus.  No skipping.  Visual fields by finger perimetry are intact. Hearing to finger rub intact.   Facial sensation intact to fine touch.  Facial motor strength is symmetric and tongue and uvula move midline.  Tongue protrusion into either cheek is normal. Shoulder shrug is normal.    Motor exam:   Normal tone , low muscle bulk and symmetric strength in upper extremities.he is unable to hip flex , right more impaired/ weaker than left.  Grip strength was checked repeatedly and was weak bilaterally.  He did not provide a full grip, more a pinch of the right hands thumb and index finger - smaller and soft thenar eminence.    Sensory:  Fine touch and vibration were tested in all extremities. Proprioception was normal.    Coordination: Rapid alternating movements in the fingers/hands were normal.  Finger-to-nose maneuver  normal and prompt without evidence of ataxia, dysmetria or tremor.   Gait and station: Patient walks without assistive device . No falls.  Proximal and distal strength within normal limits. Stance is stable.  Tandem gait is unfragmented.  Romberg testing is negative    Deep tendon reflexes: in the  upper and lower extremities are symmetric   Babinski maneuver response is down-going.       ASSESSMENT AND PLAN :   78 y.o. year old male  here with:    1)  major neurocognitive disorder with behavioral changes, advanced amyloid angiopathy and AD biomarker/ PET  positive for Alzheimer's disease. ,   Refill Aricept  and RV in 12 months with Np.   Aricept  10 mg increase discussed but decided to stay at 5 mg   Remoron for sleep has been working , please   Continue.      I would like to thank Tisovec, Charlie ORN, MD for allowing me to meet with this pleasant patient.    The patient's condition requires frequent monitoring and adjustments in the treatment plan, reflecting the ongoing complexity of care.  This provider is the continuing focal point for all needed services for this condition.  After spending a total time of  45  minutes face to face and time for  history taking, physical and neurologic examination, review of laboratory studies,  personal review of imaging studies, reports and results of other testing and review of referral information / records as far as provided in visit,   Electronically signed by: Dedra Gores, MD 11/12/2023 2:06 PM  [facility] Board certified by The [employer] of Sleep Medicine and Diplomate of the [employer] of [employer] Medicine. Board certified In Neurology through the ABPN, Fellow of the [employer] of Neu[employer].

## 2024-11-17 ENCOUNTER — Ambulatory Visit: Admitting: Adult Health
